# Patient Record
Sex: Female | Born: 1985 | Race: Black or African American | Hispanic: No | Marital: Single | Smoking: Never smoker
Health system: Southern US, Community
[De-identification: ages and names within clinical notes are randomized; demographics above are authoritative.]

## PROBLEM LIST (undated history)

## (undated) ENCOUNTER — Inpatient Hospital Stay (HOSPITAL_COMMUNITY): Payer: Self-pay

## (undated) DIAGNOSIS — B999 Unspecified infectious disease: Secondary | ICD-10-CM

## (undated) DIAGNOSIS — D649 Anemia, unspecified: Secondary | ICD-10-CM

## (undated) DIAGNOSIS — R0602 Shortness of breath: Secondary | ICD-10-CM

## (undated) HISTORY — DX: Unspecified infectious disease: B99.9

## (undated) HISTORY — DX: Anemia, unspecified: D64.9

## (undated) HISTORY — PX: ECTOPIC PREGNANCY SURGERY: SHX613

## (undated) HISTORY — DX: Shortness of breath: R06.02

---

## 2011-08-02 DIAGNOSIS — R0602 Shortness of breath: Secondary | ICD-10-CM

## 2011-08-02 HISTORY — DX: Shortness of breath: R06.02

## 2011-08-02 NOTE — L&D Delivery Note (Signed)
Delivery Note At 5:58 PM a viable female was delivered via Vaginal, Spontaneous Delivery (Presentation: Left Occiput Anterior).  APGAR: 9, 9; weight .   Placenta status: Intact, Spontaneous.  Cord: 3 vessels with the following complications: None.  Cord pH: not required. Baby had thin meconium with no problems.  Anesthesia: Epidural  Episiotomy: None Lacerations: 1st degree haemostatic Est. Blood Loss (mL): Uterus boggy: Methergine 0.2mg s IM  Mom to postpartum.  Baby to nursery-stable.  Earl Gala, CNM. 04/03/2012, 6:23 PM

## 2011-12-23 ENCOUNTER — Inpatient Hospital Stay (HOSPITAL_COMMUNITY)
Admission: AD | Admit: 2011-12-23 | Discharge: 2011-12-23 | Disposition: A | Discharge status: Home / Self Care | Payer: Self-pay | Source: Ambulatory Visit | Attending: Obstetrics and Gynecology | Admitting: Obstetrics and Gynecology

## 2011-12-23 ENCOUNTER — Encounter (HOSPITAL_COMMUNITY): Payer: Self-pay | Admitting: *Deleted

## 2011-12-23 ENCOUNTER — Inpatient Hospital Stay (HOSPITAL_COMMUNITY): Payer: Self-pay

## 2011-12-23 DIAGNOSIS — O368190 Decreased fetal movements, unspecified trimester, not applicable or unspecified: Secondary | ICD-10-CM | POA: Insufficient documentation

## 2011-12-23 DIAGNOSIS — F411 Generalized anxiety disorder: Secondary | ICD-10-CM | POA: Insufficient documentation

## 2011-12-23 DIAGNOSIS — O99340 Other mental disorders complicating pregnancy, unspecified trimester: Secondary | ICD-10-CM | POA: Insufficient documentation

## 2011-12-23 DIAGNOSIS — F419 Anxiety disorder, unspecified: Secondary | ICD-10-CM

## 2011-12-23 DIAGNOSIS — R0602 Shortness of breath: Secondary | ICD-10-CM | POA: Insufficient documentation

## 2011-12-23 NOTE — MAU Provider Note (Signed)
  History     CSN: [id number]  Arrival date and time: 12/23/11 1601   First Provider Initiated Contact with Patient 12/23/11 1747      Chief Complaint  Patient presents with  . Shortness of Breath   HPI  Pt reports feeling short of breath while talking with family member today.  Began feeling anxious while discussing decreased fetal movement.  Pt reports no prenatal care with this pregnancy.  Denies any other problems this pregnancy.  Denies chest pain or diaphoresis.    Past Medical History  Diagnosis Date  . Anxiety     self-diagnosis this preg    Past Surgical History  Procedure Date  . Ectopic pregnancy surgery     Family History  Problem Relation Age of Onset  . Diabetes Maternal Grandmother   . Hypertension Maternal Grandmother   . Stroke Maternal Grandmother   . Hypertension Paternal Grandmother   . Diabetes Paternal Grandmother   . Heart disease Paternal Grandmother     History  Substance Use Topics  . Smoking status: Never Smoker   . Smokeless tobacco: Never Used  . Alcohol Use: No    Allergies: No Known Allergies  No prescriptions prior to admission    Review of Systems  Respiratory: Positive for shortness of breath.   Psychiatric/Behavioral: The patient is nervous/anxious.   All other systems reviewed and are negative.   Physical Exam   Blood pressure 124/54, pulse 106, temperature 98.7 F (37.1 C), temperature source Oral, resp. rate 18, height 5\' 3"  (1.6 m), weight 71.668 kg (158 lb), SpO2 100.00%.  Physical Exam  Constitutional: She is oriented to person, place, and time. She appears well-developed and well-nourished. No distress.  HENT:  Head: Normocephalic.  Neck: Normal range of motion. Neck supple.  Cardiovascular: Normal rate, regular rhythm and normal heart sounds.   Respiratory: Effort normal and breath sounds normal. No respiratory distress. She has no wheezes. She has no rales.  GI: Soft. There is no tenderness.       Fundal  height - 25  Genitourinary: No bleeding around the vagina. Vaginal discharge (mucusy) found.  Neurological: She is alert and oriented to person, place, and time.  Skin: Skin is warm and dry.    MAU Course  Procedures Ultrasound IMPRESSION:  Single intrauterine pregnancy of approximally 26 weeks 4 days  gestation. Presentation is cephalic. Placenta is posterior.  FHR 150, 10x10 Toco - none Assessment and Plan  Anxiety Fetal Well-Being  Plan: DC to home Keep scheduled appointment with Dr. Clearance Coots Provided reassurance [facility] 12/23/2011, 5:50 PM

## 2011-12-23 NOTE — MAU Note (Signed)
Feeling short of breath today, more after eating lunch. No hx of asthma. States she feels this way whether she is moving or being still. Also has felt some pressure like reflux in upper abdomen and chest.

## 2011-12-23 NOTE — MAU Note (Signed)
No PNC. Medicaid in process. Has been going to pregnancy care center and has had U/S.

## 2011-12-23 NOTE — MAU Note (Signed)
A person with patient asking personal questions about patient and asking that patient get an U/S. Informed this person that patient information will not be discussed with her. Informed her that if patient wants her to be present, she can observe and listen, but she is not to ask questions or make demands regarding patient. The patient is now saying that she wants an U/S, that though she has had an U/S, does not know how far along she is. Asked patient if she came to MAU for shortness of breath or for an U/S. Patient said she came for both. Person with her thinks her SOB came from a panic attack because she was not feeling the baby move. Patient shows know signs of distress, is able to carry on unlimited conversation with friend and son.

## 2011-12-23 NOTE — Discharge Instructions (Signed)

## 2012-01-12 ENCOUNTER — Encounter (HOSPITAL_COMMUNITY): Payer: Self-pay | Admitting: *Deleted

## 2012-02-24 ENCOUNTER — Ambulatory Visit (INDEPENDENT_AMBULATORY_CARE_PROVIDER_SITE_OTHER): Payer: Medicaid Other | Admitting: Obstetrics and Gynecology

## 2012-02-24 DIAGNOSIS — Z331 Pregnant state, incidental: Secondary | ICD-10-CM

## 2012-02-24 DIAGNOSIS — Z3689 Encounter for other specified antenatal screening: Secondary | ICD-10-CM

## 2012-02-24 LAB — POCT URINALYSIS DIPSTICK
Nitrite, UA: NEGATIVE
Urobilinogen, UA: NEGATIVE

## 2012-02-24 NOTE — Progress Notes (Signed)
NOB interview completed.  Pt is late to care.  Seen once @ [facility].  Pt sched for NOB w/u on 02/29/12 @1100  w/ MK, also scheduled anatomy scan @ 1400.

## 2012-02-25 LAB — PRENATAL PANEL VII
Eosinophils Absolute: 0.1 10*3/uL (ref 0.0–0.7)
HCT: 25.2 % — ABNORMAL LOW (ref 36.0–46.0)
HIV: NONREACTIVE
Hemoglobin: 7.6 g/dL — ABNORMAL LOW (ref 12.0–15.0)
Hepatitis B Surface Ag: NEGATIVE
Lymphs Abs: 1.6 10*3/uL (ref 0.7–4.0)
MCH: 18 pg — ABNORMAL LOW (ref 26.0–34.0)
Monocytes Relative: 12 % (ref 3–12)
Neutro Abs: 3.7 10*3/uL (ref 1.7–7.7)
Neutrophils Relative %: 61 % (ref 43–77)
RBC: 4.22 MIL/uL (ref 3.87–5.11)
Rubella: 17 IU/mL — ABNORMAL HIGH

## 2012-02-27 LAB — CULTURE, OB URINE: Colony Count: >=100000

## 2012-02-28 LAB — HEMOGLOBINOPATHY EVALUATION: Hemoglobin Other: 0 %

## 2012-02-29 ENCOUNTER — Ambulatory Visit (INDEPENDENT_AMBULATORY_CARE_PROVIDER_SITE_OTHER): Payer: Medicaid Other | Admitting: Obstetrics and Gynecology

## 2012-02-29 ENCOUNTER — Ambulatory Visit (INDEPENDENT_AMBULATORY_CARE_PROVIDER_SITE_OTHER): Payer: Medicaid Other

## 2012-02-29 VITALS — BP 110/68 | Wt 167.0 lb

## 2012-02-29 DIAGNOSIS — D649 Anemia, unspecified: Secondary | ICD-10-CM

## 2012-02-29 DIAGNOSIS — O0930 Supervision of pregnancy with insufficient antenatal care, unspecified trimester: Secondary | ICD-10-CM

## 2012-02-29 DIAGNOSIS — Z3689 Encounter for other specified antenatal screening: Secondary | ICD-10-CM

## 2012-02-29 DIAGNOSIS — Z331 Pregnant state, incidental: Secondary | ICD-10-CM

## 2012-02-29 DIAGNOSIS — D509 Iron deficiency anemia, unspecified: Secondary | ICD-10-CM

## 2012-02-29 DIAGNOSIS — O2390 Unspecified genitourinary tract infection in pregnancy, unspecified trimester: Secondary | ICD-10-CM

## 2012-02-29 DIAGNOSIS — E611 Iron deficiency: Secondary | ICD-10-CM

## 2012-02-29 DIAGNOSIS — B951 Streptococcus, group B, as the cause of diseases classified elsewhere: Secondary | ICD-10-CM

## 2012-02-29 DIAGNOSIS — O2340 Unspecified infection of urinary tract in pregnancy, unspecified trimester: Secondary | ICD-10-CM

## 2012-02-29 DIAGNOSIS — N390 Urinary tract infection, site not specified: Secondary | ICD-10-CM

## 2012-02-29 LAB — RETICULOCYTES
RBC.: 4.07 MIL/uL (ref 3.87–5.11)
Retic Ct Pct: 2.3 % (ref 0.4–2.3)

## 2012-03-01 ENCOUNTER — Encounter: Payer: Self-pay | Admitting: Obstetrics and Gynecology

## 2012-03-01 ENCOUNTER — Telehealth: Payer: Self-pay | Admitting: Obstetrics and Gynecology

## 2012-03-01 DIAGNOSIS — O0930 Supervision of pregnancy with insufficient antenatal care, unspecified trimester: Secondary | ICD-10-CM | POA: Insufficient documentation

## 2012-03-01 DIAGNOSIS — B951 Streptococcus, group B, as the cause of diseases classified elsewhere: Secondary | ICD-10-CM | POA: Insufficient documentation

## 2012-03-01 DIAGNOSIS — D649 Anemia, unspecified: Secondary | ICD-10-CM | POA: Insufficient documentation

## 2012-03-01 LAB — GC/CHLAMYDIA PROBE AMP, GENITAL: GC Probe Amp, Genital: NEGATIVE

## 2012-03-01 LAB — PATHOLOGIST SMEAR REVIEW

## 2012-03-01 LAB — IRON AND TIBC: TIBC: 591 ug/dL — ABNORMAL HIGH (ref 250–470)

## 2012-03-01 MED ORDER — PENICILLIN V POTASSIUM 500 MG PO TABS: 500.0000 mg | ORAL_TABLET | Freq: Three times a day (TID) | ORAL | Status: AC

## 2012-03-01 NOTE — Telephone Encounter (Signed)
Message copied by Mason Jim on Thu Mar 01, 2012  3:37 PM ------      Message from: [facility], Corrie Dandy      Created: Thu Mar 01, 2012  1:51 PM      Regarding: RX and call to patient       Pen VK 500 mg po TID x 10 days       #30

## 2012-03-01 NOTE — Progress Notes (Signed)
C/O vag irritation. GC/CT sent. Pt is GBS + (urine). Follow up anatomy U/S today. Follow up in 1 week.

## 2012-03-01 NOTE — Progress Notes (Signed)
Patient ID: Judy Harris, female   DOB: 21-Aug-1985, 26 y.o.   MRN: [id number]  Judy Harris is here for a new obstetrical visit at 36 weeks, had an MAU visit at [facility] with [place] for dating. Uncertain of LMP. Denies SROM, vag bleeding, with +FM. "Craves flour I just keep eating it"Taking PNV and iron at times. [redacted]w[redacted]d Late PNC GBS + urine  @IPILAPH @ OB History    Grav Para Term Preterm Abortions TAB SAB Ect Mult Living   3 1 0 1 1   1  1      Past Medical History  Diagnosis Date  . Anemia     Has taken Iron supp;is currently taking now  . Infection     Yeast inf;not freq  . 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 Infection     UTI;no freq  . SOB (shortness of breath) 2013    Since the preg;SOB @ times   Past Surgical History  Procedure Date  . Ectopic pregnancy surgery    Family History: family history includes Cancer in her paternal aunt; Diabetes in her maternal grandmother and paternal grandmother; Heart disease in her paternal grandmother; Hypertension in her maternal grandmother and paternal grandmother; and Stroke in her maternal grandmother. Social History:  reports that she has never smoked. She has never used smokeless tobacco. She reports that she drinks alcohol. She reports that she does not use illicit drugs.  Dilation: Closed Blood pressure 110/68, weight 167 lb (75.751 kg).  Physical exam: Calm, no distress Alert, appropriate appearance for age. No acute distress HEENT Grossly normal Thyroid no masses, nodes or enlargement  lungs clear bilaterally, AP RRR, breasts bilaterally no masses, dimpling, drainage, abd soft, gravid, nt, bowel sounds active no edema to lower extremities, DTRS +2 bilaterally EGBUS and perineum wnl, normal hair distribution Vagina pink moist Cervix LTC no cerical motion tenderness Yellow mucus discharge appears adequate Fundal height 36 FHTS 150  Prenatal labs: ABO, Rh: O/POS/-- (07/26 1212) Antibody: NEG (07/26 1212) Rubella:   RPR:  NON REAC (07/26 1212)  HBsAg: NEGATIVE (07/26 1212)  HIV: NON REACTIVE (07/26 1212)  GBS:   positive urine  Assessment/Plan: [redacted]w[redacted]d  Gc/chl sent to lab Wet prep: neg trich, +hyphae, +clue Pap: 2012 last will do after delivery [place]: at [facility] for dating will obtain US anatomy today. Genetic screen: to late Anemia lab workup discussed. RX flagyl, diflucan, Pen VK, colace, iron discussed.  Collaboration with Dr. Stefano Harris. [facility], Judy Harris 03/01/2012, 6:32 PM Judy Harris, CNM

## 2012-03-01 NOTE — Telephone Encounter (Signed)
TC to pt.  Per [facility] informed of urine culture results and Rx.  Pt states was given RX for Flagyl and Diflucan 02/29/12.  Took one dose today. Questioning if should take all the meds concurrently.  Per consult with MK, pt to take Pen VK now and D/C Flagyl and Diflucan.  When Pen VK completed, then restart Flagyl and take Diflucan near end of treatment.  Pt verbalizes comprehension. +FM.

## 2012-03-02 LAB — HEMOGLOBINOPATHY EVALUATION
Hgb A: 98.9 % — ABNORMAL HIGH (ref 96.8–97.8)
Hgb F Quant: 0 % (ref 0.0–2.0)
Hgb S Quant: 0 %

## 2012-03-06 ENCOUNTER — Telehealth: Payer: Self-pay | Admitting: Obstetrics and Gynecology

## 2012-03-06 NOTE — Telephone Encounter (Signed)
TC to pt. LM to return call. Per [facility] pt to be informed that will need 1 hour glucola  At visit 03/07/12. To eat appropriately prior to visit.

## 2012-03-07 ENCOUNTER — Ambulatory Visit (INDEPENDENT_AMBULATORY_CARE_PROVIDER_SITE_OTHER): Payer: Medicaid Other | Admitting: Obstetrics and Gynecology

## 2012-03-07 ENCOUNTER — Other Ambulatory Visit: Payer: Self-pay | Admitting: Obstetrics and Gynecology

## 2012-03-07 ENCOUNTER — Encounter: Payer: Self-pay | Admitting: Obstetrics and Gynecology

## 2012-03-07 VITALS — BP 124/56 | Wt 169.0 lb

## 2012-03-07 DIAGNOSIS — D649 Anemia, unspecified: Secondary | ICD-10-CM

## 2012-03-07 DIAGNOSIS — O0930 Supervision of pregnancy with insufficient antenatal care, unspecified trimester: Secondary | ICD-10-CM

## 2012-03-07 LAB — US OB DETAIL + 14 WK

## 2012-03-07 NOTE — Progress Notes (Signed)
NO CONCERNS 

## 2012-03-07 NOTE — Progress Notes (Signed)
Patient ID: Judy Harris, female   DOB: [birth year], 26 y.o.   MRN: [id number] Anemia work up WNL review with Dr. Stefano Gaul. [place] on 7/31 AFI 15 BPP 8/8 50% growth  VTX anatomy limited  Reviewed s/s uc, srom, vag bleeding, daily fetal kick counts to report, comfort measures. Encouragged 8 water daily and frequent voids. Lavera Guise, CNM

## 2012-03-08 LAB — US OB COMP + 14 WK

## 2012-03-12 ENCOUNTER — Ambulatory Visit (INDEPENDENT_AMBULATORY_CARE_PROVIDER_SITE_OTHER): Payer: Medicaid Other | Admitting: Obstetrics and Gynecology

## 2012-03-12 VITALS — BP 130/74 | Wt 171.0 lb

## 2012-03-12 DIAGNOSIS — Z331 Pregnant state, incidental: Secondary | ICD-10-CM

## 2012-03-12 NOTE — Progress Notes (Signed)
[redacted]w[redacted]d No complaints 

## 2012-03-12 NOTE — Progress Notes (Signed)
GBS + Pt has no complaints today. No cx check please.

## 2012-03-14 ENCOUNTER — Telehealth: Payer: Self-pay

## 2012-03-14 ENCOUNTER — Other Ambulatory Visit: Payer: Medicaid Other

## 2012-03-14 ENCOUNTER — Other Ambulatory Visit: Payer: Self-pay

## 2012-03-14 DIAGNOSIS — Z331 Pregnant state, incidental: Secondary | ICD-10-CM

## 2012-03-14 NOTE — Telephone Encounter (Signed)
Per AVS pt needs NST' twice weekly.' Pt was called this morning to schedule NST's for this week. Pt stated she cannot come in this week b/c she has to work. Pt stated she will be off next Tuesday, Wednesday, Thursday. Pt already has a ROB visit on 03-20-12 w/ [place] @ 4:20pm.  NST scheduled for 03-20-12 @ 3:30pm. Pt stated she will make next NST appt when she comes in the office Tuesday.  [facility] CMA

## 2012-03-20 ENCOUNTER — Other Ambulatory Visit: Payer: Medicaid Other

## 2012-03-20 ENCOUNTER — Encounter: Payer: Medicaid Other | Admitting: Obstetrics and Gynecology

## 2012-03-20 ENCOUNTER — Ambulatory Visit: Payer: Medicaid Other | Admitting: Obstetrics and Gynecology

## 2012-03-21 NOTE — Progress Notes (Unsigned)
DNKA-will have office follow-up to R/S patient.

## 2012-03-22 ENCOUNTER — Other Ambulatory Visit: Payer: Medicaid Other

## 2012-03-22 ENCOUNTER — Ambulatory Visit (INDEPENDENT_AMBULATORY_CARE_PROVIDER_SITE_OTHER): Payer: Medicaid Other | Admitting: Obstetrics and Gynecology

## 2012-03-22 ENCOUNTER — Encounter: Payer: Self-pay | Admitting: Obstetrics and Gynecology

## 2012-03-22 ENCOUNTER — Other Ambulatory Visit: Payer: Medicaid Other | Admitting: Obstetrics and Gynecology

## 2012-03-22 DIAGNOSIS — O365990 Maternal care for other known or suspected poor fetal growth, unspecified trimester, not applicable or unspecified: Secondary | ICD-10-CM

## 2012-03-22 NOTE — Progress Notes (Signed)
[redacted]w[redacted]d Nonstress test is reactive.  Twice weekly nonstress test. Growth ultrasound next visit. Note for work given. Return to office in 1 week. Repeat blood pressure 134/72. Dr. Stefano Gaul

## 2012-03-24 NOTE — Progress Notes (Signed)
Anemia workup shows iron deficiency anemia. Urine culture contain beta strep.  We'll treat in labor.  Dr. Stefano Gaul

## 2012-03-28 ENCOUNTER — Ambulatory Visit (INDEPENDENT_AMBULATORY_CARE_PROVIDER_SITE_OTHER): Payer: Medicaid Other

## 2012-03-28 ENCOUNTER — Encounter: Payer: Self-pay | Admitting: Obstetrics and Gynecology

## 2012-03-28 ENCOUNTER — Ambulatory Visit (INDEPENDENT_AMBULATORY_CARE_PROVIDER_SITE_OTHER): Payer: Medicaid Other | Admitting: Obstetrics and Gynecology

## 2012-03-28 ENCOUNTER — Other Ambulatory Visit: Payer: Self-pay | Admitting: Obstetrics and Gynecology

## 2012-03-28 VITALS — BP 108/60 | Wt 170.0 lb

## 2012-03-28 DIAGNOSIS — O0930 Supervision of pregnancy with insufficient antenatal care, unspecified trimester: Secondary | ICD-10-CM

## 2012-03-28 DIAGNOSIS — O365990 Maternal care for other known or suspected poor fetal growth, unspecified trimester, not applicable or unspecified: Secondary | ICD-10-CM

## 2012-03-28 DIAGNOSIS — O26849 Uterine size-date discrepancy, unspecified trimester: Secondary | ICD-10-CM

## 2012-03-28 NOTE — Progress Notes (Signed)
[redacted]w[redacted]d  Ultrasound shows:  EFW 6lb 6oz   17%ILE       AFI: 13.0= 55%ile             Placenta localization: posterior            Fetal presentation: vertex    Anatomy survey is normal            Gender : female    Comments: vertex presentation, posterior placenta.  Normal fluid: AFI = 55th%tile EFW = 17%tile Score BPP 8 of 8 in 20 minutes,  Rec: induction at 41 wks if undelivered

## 2012-03-29 ENCOUNTER — Telehealth: Payer: Self-pay | Admitting: Obstetrics and Gynecology

## 2012-03-29 NOTE — Telephone Encounter (Signed)
Induction scheduled for 04/03/12 @ 7:30am with DD/AR. -Adrianne Pridgen

## 2012-03-30 ENCOUNTER — Other Ambulatory Visit: Payer: Medicaid Other

## 2012-03-30 ENCOUNTER — Telehealth (HOSPITAL_COMMUNITY): Payer: Self-pay | Admitting: *Deleted

## 2012-03-30 ENCOUNTER — Encounter (HOSPITAL_COMMUNITY): Payer: Self-pay | Admitting: *Deleted

## 2012-03-30 LAB — US OB FOLLOW UP

## 2012-03-30 NOTE — Telephone Encounter (Signed)
Preadmission screen  

## 2012-04-03 ENCOUNTER — Encounter (HOSPITAL_COMMUNITY): Payer: Self-pay | Admitting: Anesthesiology

## 2012-04-03 ENCOUNTER — Inpatient Hospital Stay (HOSPITAL_COMMUNITY): Payer: Medicaid Other | Admitting: Anesthesiology

## 2012-04-03 ENCOUNTER — Encounter (HOSPITAL_COMMUNITY): Payer: Self-pay

## 2012-04-03 ENCOUNTER — Inpatient Hospital Stay (HOSPITAL_COMMUNITY)
Admission: RE | Admit: 2012-04-03 | Discharge: 2012-04-05 | DRG: 775 | Disposition: A | Discharge status: Home / Self Care | Payer: Medicaid Other | Source: Ambulatory Visit | Attending: Obstetrics and Gynecology | Admitting: Obstetrics and Gynecology

## 2012-04-03 DIAGNOSIS — O99892 Other specified diseases and conditions complicating childbirth: Secondary | ICD-10-CM | POA: Diagnosis present

## 2012-04-03 DIAGNOSIS — O480 Post-term pregnancy: Principal | ICD-10-CM | POA: Diagnosis present

## 2012-04-03 DIAGNOSIS — O2340 Unspecified infection of urinary tract in pregnancy, unspecified trimester: Secondary | ICD-10-CM | POA: Diagnosis present

## 2012-04-03 DIAGNOSIS — O9902 Anemia complicating childbirth: Secondary | ICD-10-CM | POA: Diagnosis present

## 2012-04-03 DIAGNOSIS — B951 Streptococcus, group B, as the cause of diseases classified elsewhere: Secondary | ICD-10-CM | POA: Diagnosis present

## 2012-04-03 DIAGNOSIS — D649 Anemia, unspecified: Secondary | ICD-10-CM | POA: Diagnosis present

## 2012-04-03 DIAGNOSIS — O0930 Supervision of pregnancy with insufficient antenatal care, unspecified trimester: Secondary | ICD-10-CM

## 2012-04-03 DIAGNOSIS — Z22330 Carrier of Group B streptococcus: Secondary | ICD-10-CM

## 2012-04-03 LAB — CBC
HCT: 26.8 % — ABNORMAL LOW (ref 36.0–46.0)
MCH: 18.7 pg — ABNORMAL LOW (ref 26.0–34.0)
MCHC: 28 g/dL — ABNORMAL LOW (ref 30.0–36.0)
MCV: 66.8 fL — ABNORMAL LOW (ref 78.0–100.0)
Platelets: 208 10*3/uL (ref 150–400)
RDW: 22.9 % — ABNORMAL HIGH (ref 11.5–15.5)
WBC: 6.1 10*3/uL (ref 4.0–10.5)

## 2012-04-03 LAB — ABO/RH: ABO/RH(D): O POS

## 2012-04-03 MED ORDER — DIPHENHYDRAMINE HCL 25 MG PO CAPS: 25.0000 mg | ORAL_CAPSULE | Freq: Four times a day (QID) | ORAL | Status: DC | PRN

## 2012-04-03 MED ORDER — LACTATED RINGERS IV SOLN
INTRAVENOUS | Status: DC
  Administered 2012-04-03: 09:00:00 via INTRAVENOUS
  Administered 2012-04-03 (×2): 125 mL/h via INTRAVENOUS

## 2012-04-03 MED ORDER — FENTANYL CITRATE 0.05 MG/ML IJ SOLN: 100.0000 ug | INTRAMUSCULAR | Status: DC | PRN

## 2012-04-03 MED ORDER — FENTANYL 2.5 MCG/ML BUPIVACAINE 1/10 % EPIDURAL INFUSION (WH - ANES)
14.0000 mL/h | INTRAMUSCULAR | Status: DC
  Administered 2012-04-03: 14 mL/h via EPIDURAL
  Filled 2012-04-03 (×2): qty 60

## 2012-04-03 MED ORDER — PRENATAL MULTIVITAMIN CH
1.0000 | ORAL_TABLET | Freq: Every day | ORAL | Status: DC
  Administered 2012-04-04 – 2012-04-05 (×2): 1 via ORAL
  Filled 2012-04-03 (×2): qty 1

## 2012-04-03 MED ORDER — PENICILLIN G POTASSIUM 5000000 UNITS IJ SOLR
2.5000 10*6.[IU] | INTRAVENOUS | Status: DC
  Administered 2012-04-03: 2.5 10*6.[IU] via INTRAVENOUS
  Filled 2012-04-03 (×4): qty 2.5

## 2012-04-03 MED ORDER — WITCH HAZEL-GLYCERIN EX PADS: 1.0000 "application " | MEDICATED_PAD | CUTANEOUS | Status: DC | PRN

## 2012-04-03 MED ORDER — TETANUS-DIPHTH-ACELL PERTUSSIS 5-2.5-18.5 LF-MCG/0.5 IM SUSP: 0.5000 mL | Freq: Once | INTRAMUSCULAR | Status: DC

## 2012-04-03 MED ORDER — SENNOSIDES-DOCUSATE SODIUM 8.6-50 MG PO TABS
2.0000 | ORAL_TABLET | Freq: Every day | ORAL | Status: DC
  Administered 2012-04-03 – 2012-04-04 (×2): 2 via ORAL

## 2012-04-03 MED ORDER — DIPHENHYDRAMINE HCL 50 MG/ML IJ SOLN: 12.5000 mg | INTRAMUSCULAR | Status: DC | PRN

## 2012-04-03 MED ORDER — OXYCODONE-ACETAMINOPHEN 5-325 MG PO TABS: 1.0000 | ORAL_TABLET | ORAL | Status: DC | PRN

## 2012-04-03 MED ORDER — FENTANYL 2.5 MCG/ML BUPIVACAINE 1/10 % EPIDURAL INFUSION (WH - ANES)
INTRAMUSCULAR | Status: DC | PRN
  Administered 2012-04-03: 14 mL/h via EPIDURAL

## 2012-04-03 MED ORDER — PHENYLEPHRINE 40 MCG/ML (10ML) SYRINGE FOR IV PUSH (FOR BLOOD PRESSURE SUPPORT)
80.0000 ug | PREFILLED_SYRINGE | INTRAVENOUS | Status: DC | PRN
  Filled 2012-04-03: qty 5
  Filled 2012-04-03: qty 2

## 2012-04-03 MED ORDER — LACTATED RINGERS IV SOLN
500.0000 mL | INTRAVENOUS | Status: DC | PRN
  Administered 2012-04-03: 300 mL via INTRAVENOUS
  Administered 2012-04-03 (×2): 500 mL via INTRAVENOUS
  Administered 2012-04-03: 1000 mL via INTRAVENOUS

## 2012-04-03 MED ORDER — LANOLIN HYDROUS EX OINT: TOPICAL_OINTMENT | CUTANEOUS | Status: DC | PRN

## 2012-04-03 MED ORDER — ACETAMINOPHEN 325 MG PO TABS: 650.0000 mg | ORAL_TABLET | ORAL | Status: DC | PRN

## 2012-04-03 MED ORDER — PHENYLEPHRINE 40 MCG/ML (10ML) SYRINGE FOR IV PUSH (FOR BLOOD PRESSURE SUPPORT)
80.0000 ug | PREFILLED_SYRINGE | INTRAVENOUS | Status: DC | PRN
  Filled 2012-04-03: qty 2

## 2012-04-03 MED ORDER — LIDOCAINE HCL (PF) 1 % IJ SOLN
INTRAMUSCULAR | Status: DC | PRN
  Administered 2012-04-03 (×2): 9 mL

## 2012-04-03 MED ORDER — OXYCODONE-ACETAMINOPHEN 5-325 MG PO TABS
1.0000 | ORAL_TABLET | ORAL | Status: DC | PRN
  Administered 2012-04-04 (×2): 1 via ORAL
  Filled 2012-04-03 (×2): qty 1

## 2012-04-03 MED ORDER — OXYTOCIN 40 UNITS IN LACTATED RINGERS INFUSION - SIMPLE MED
62.5000 mL/h | Freq: Once | INTRAVENOUS | Status: DC
  Filled 2012-04-03: qty 1000

## 2012-04-03 MED ORDER — OXYTOCIN BOLUS FROM INFUSION
250.0000 mL | Freq: Once | INTRAVENOUS | Status: AC
  Administered 2012-04-03: 250 mL via INTRAVENOUS
  Filled 2012-04-03: qty 500

## 2012-04-03 MED ORDER — SIMETHICONE 80 MG PO CHEW: 80.0000 mg | CHEWABLE_TABLET | ORAL | Status: DC | PRN

## 2012-04-03 MED ORDER — BENZOCAINE-MENTHOL 20-0.5 % EX AERO: 1.0000 "application " | INHALATION_SPRAY | CUTANEOUS | Status: DC | PRN

## 2012-04-03 MED ORDER — CITRIC ACID-SODIUM CITRATE 334-500 MG/5ML PO SOLN
30.0000 mL | ORAL | Status: DC | PRN
  Filled 2012-04-03: qty 15

## 2012-04-03 MED ORDER — ONDANSETRON HCL 4 MG/2ML IJ SOLN
4.0000 mg | Freq: Four times a day (QID) | INTRAMUSCULAR | Status: DC | PRN
  Filled 2012-04-03: qty 2

## 2012-04-03 MED ORDER — ZOLPIDEM TARTRATE 5 MG PO TABS: 5.0000 mg | ORAL_TABLET | Freq: Every evening | ORAL | Status: DC | PRN

## 2012-04-03 MED ORDER — PENICILLIN G POTASSIUM 5000000 UNITS IJ SOLR
5.0000 10*6.[IU] | Freq: Once | INTRAVENOUS | Status: AC
  Administered 2012-04-03: 5 10*6.[IU] via INTRAVENOUS
  Filled 2012-04-03: qty 5

## 2012-04-03 MED ORDER — TERBUTALINE SULFATE 1 MG/ML IJ SOLN
INTRAMUSCULAR | Status: AC
  Administered 2012-04-03: 0.25 mg via SUBCUTANEOUS
  Filled 2012-04-03: qty 1

## 2012-04-03 MED ORDER — FLEET ENEMA 7-19 GM/118ML RE ENEM: 1.0000 | ENEMA | RECTAL | Status: DC | PRN

## 2012-04-03 MED ORDER — IBUPROFEN 600 MG PO TABS
600.0000 mg | ORAL_TABLET | Freq: Four times a day (QID) | ORAL | Status: DC
  Administered 2012-04-03 – 2012-04-05 (×7): 600 mg via ORAL
  Filled 2012-04-03 (×7): qty 1

## 2012-04-03 MED ORDER — LACTATED RINGERS IV SOLN: 500.0000 mL | Freq: Once | INTRAVENOUS | Status: DC

## 2012-04-03 MED ORDER — IBUPROFEN 600 MG PO TABS: 600.0000 mg | ORAL_TABLET | Freq: Four times a day (QID) | ORAL | Status: DC | PRN

## 2012-04-03 MED ORDER — EPHEDRINE 5 MG/ML INJ
10.0000 mg | INTRAVENOUS | Status: DC | PRN
  Filled 2012-04-03: qty 2
  Filled 2012-04-03: qty 4

## 2012-04-03 MED ORDER — LIDOCAINE HCL (PF) 1 % IJ SOLN
30.0000 mL | INTRAMUSCULAR | Status: DC | PRN
  Filled 2012-04-03: qty 30

## 2012-04-03 MED ORDER — METHYLERGONOVINE MALEATE 0.2 MG/ML IJ SOLN
INTRAMUSCULAR | Status: AC
  Administered 2012-04-03: 0.2 mg via INTRAMUSCULAR
  Filled 2012-04-03: qty 1

## 2012-04-03 MED ORDER — MISOPROSTOL 25 MCG QUARTER TABLET
25.0000 ug | ORAL_TABLET | ORAL | Status: DC
  Administered 2012-04-03: 25 ug via VAGINAL
  Filled 2012-04-03: qty 0.25

## 2012-04-03 MED ORDER — ONDANSETRON HCL 4 MG/2ML IJ SOLN
4.0000 mg | Freq: Four times a day (QID) | INTRAMUSCULAR | Status: DC | PRN
  Administered 2012-04-03: 4 mg via INTRAVENOUS

## 2012-04-03 MED ORDER — EPHEDRINE 5 MG/ML INJ
10.0000 mg | INTRAVENOUS | Status: DC | PRN
  Filled 2012-04-03: qty 2

## 2012-04-03 MED ORDER — DIBUCAINE 1 % RE OINT: 1.0000 "application " | TOPICAL_OINTMENT | RECTAL | Status: DC | PRN

## 2012-04-03 NOTE — Anesthesia Procedure Notes (Signed)
Epidural Patient location during procedure: OB Start time: 04/03/2012 1:12 PM End time: 04/03/2012 1:16 PM  Staffing Anesthesiologist: Sandrea Hughs Performed by: anesthesiologist   Preanesthetic Checklist Completed: patient identified, site marked, surgical consent, pre-op evaluation, timeout performed, IV checked, risks and benefits discussed and monitors and equipment checked  Epidural Patient position: sitting Prep: site prepped and draped and DuraPrep Patient monitoring: continuous pulse ox and blood pressure Approach: midline Injection technique: LOR air  Needle:  Needle type: Tuohy  Needle gauge: 17 G Needle length: 9 cm and 9 Needle insertion depth: 5 cm cm Catheter type: closed end flexible Catheter size: 19 Gauge Catheter at skin depth: 10 cm Test dose: negative and Other  Assessment Sensory level: T8 Events: blood not aspirated, injection not painful, no injection resistance, negative IV test and no paresthesia  Additional Notes Reason for block:procedure for pain

## 2012-04-03 NOTE — Progress Notes (Signed)
Terbutaline 0.25mg s s/c x 1 (2nd dose) Uterine activity better now and ucs 1: 2 - 3 mins FHT's 155 bpm Earl Gala, CNM.

## 2012-04-03 NOTE — H&P (Signed)
Judy Harris is a 26 y.o. female, G3P1011 at 51w1dweeks, presenting for IOL for post dates.  Patient Active Problem List  Diagnosis  . Late prenatal care  . GBS (group B streptococcus) UTI complicating currentpregnancy  . Anemia  . Preterm delivery, delivered  . Significant discrepancy between uterine size and clinical dates, antepartum    History of present pregnancy: Patient entered care at [redacted]w[redacted]d weeks.  EDC of 03/26/12 was established by USS.  Anatomy USS was not completed due to advanced GA. Posterior Placenta. Late to [facility] at [redacted]w[redacted]d which has complicated pregnancy management. Issues with insurance cover. GBS Positive: Will need to have GBS prophylaxis in labor.  OB History    Grav Para Term Preterm Abortions TAB SAB Ect Mult Living   3 1 0 1 1   1  1      Past Medical History  Diagnosis Date  . Anemia     Has taken Iron supp;is currently taking now  . Infection     Yeast inf;not freq  . 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 Infection     UTI;no freq  . SOB (shortness of breath) 2013    Since the preg;SOB @ times   Past Surgical History  Procedure Date  . Ectopic pregnancy surgery    Family History: family history includes Cancer in her paternal aunt; Diabetes in her maternal grandmother and paternal grandmother; Heart disease in her paternal grandmother; Hypertension in her maternal grandmother and paternal grandmother; and Stroke in her maternal grandmother. Social History:  reports that she has never smoked. She has never used smokeless tobacco. She reports that she drinks alcohol. She reports that she does not use illicit drugs.  ROS:  Affect; Nervous as needle phobia.            Lungs: Bilaterally Clear            CV:      RRR            Abdomen: Soft, Gravid, and measures 36 weeks approx,                             BS x 4 non tender.            Leopold's; Vx resentation.            GU: Normal            Extremities: No swelling, no edema.                  No Known Allergies   Dilation: 1.5 Effacement (%): 40 Station: -2 Exam by:: Earl Gala, CNM Blood pressure 133/80, pulse 116, temperature 98.1 F (36.7 C), temperature source Oral, resp. rate 18, height 5\' 4"  (1.626 m), weight 170 lb (77.111 kg).  Chest clear Heart RRR without murmur Abd gravid, NT, FH 145 bpm Cat 1 Tracing Pelvic: as above UCs:  1: 7 - 8 mins mild.  Prenatal labs: ABO, Rh: O/POS/-- (07/26 1212) Antibody: NEG (07/26 1212) Rubella:   Immune RPR: NON REAC (07/26 1212)  HBsAg: NEGATIVE (07/26 1212)  HIV: NON REACTIVE (07/26 1212)  GBS: Positive (07/26 0000) Sickle cell/Hgb electrophoresis:  neg Pap: Not collected GC: neg Chlamydia:  Neg Genetic screenings: not done Glucola:  To late to [facility] - not completed        Assessment/Plan: IUP at [redacted]w[redacted]d   Plan: IOL Cytotec to ripen Cervix.  Zacharias Ridling,  [place], [place] 04/03/2012, 9:29 AM

## 2012-04-03 NOTE — Progress Notes (Signed)
Patient has had 2 decelerations in past 15 mins since placement of Cytotec 25 mcgs PV  O VSS     Baseline 145 bpm,  First deceerationl at approx. 11.10 hrs.  Decel to the 80's and position change and      Spontaneously to baseline 1465 bpm with moderate variability.     15 mins later the FHT's deceleration to 80's lasting 6 mins. Intra uterine reassociation.- position change to      [place] position. With these measures FHT;s return to baseline 145 bpm with moderate variability.     Position and O2 via mask, Bolus IV fluid LR. Uc's 1: 2 -3 mins regular.     Patient is tolerating contractions well.     Dr Su Hilt contacted and informed of the events.  A IOL postdates - [redacted]w[redacted]d  - has tried Cytotec25 mcgs x 1 with decelerations.  P To stop cytotec stat     Allow 30 mins for baby to recover.     Foley bulb to water traction.   Earl Gala, CNM.

## 2012-04-03 NOTE — Progress Notes (Signed)
Patient is now contracting 1: 2 mins - has had a run of tachysystolia. O BP 121/74  Pulse 79  Temp 98.2 F (36.8 C) (Axillary)  Resp 20  Ht 5\' 4"  (1.626 m)  Wt 170 lb (77.111 kg)  BMI 29.18 kg/m2       fhts baseline 155 with moderate variability      12.10 hrs AROM - clear, small amount of fluid, FSE and IUPC placed.      12.10 hrs spontaneous deceleration for 5 mins, Position change to Gaskin's position.       FHT's  Return to baseline with this maneuver.      Dr. Su Hilt called and to have Terbutaline 0.25mg s s/c x 1 stat.      To Observe and if starts to space to start Pitocin      abd soft between uc      Contractions1 :       Vag 4/80%/-1 AROM, internalized A  AROM with internal monitors P  Terb and Observe   Earl Gala, CNM.

## 2012-04-03 NOTE — Progress Notes (Signed)
Comfortable,with epidural O BP 113/72  Pulse 99  Temp 98.5 F (36.9 C) (Oral)  Resp 20  Ht 5\' 4"  (1.626 m)  Wt 170 lb (77.111 kg)  BMI 29.18 kg/m2  SpO2 100%       fhts  Had deceleration to 80's, position changed and IUPC had fallen out  And replaced.      FSE attached but not working well - external monitor continues.      abd soft between uc.       Contractions 1: 2  mins        SVE: 4- 5 loose, 80%/-2,  Vx not well applied to Cx, moulding noted.  A IOL for post dates. S/p Cytotec x 1 dose, AROM, IUPC with External FHT  P Continue care , Hold on order for Terbutaline at the moment    Continue to monitor.  Earl Gala, CNM.

## 2012-04-03 NOTE — Anesthesia Preprocedure Evaluation (Signed)
Anesthesia Evaluation  Patient identified by MRN, date of birth, ID band Patient awake    Reviewed: Allergy & Precautions, H&P , NPO status , Patient's Chart, lab work & pertinent test results  Airway Mallampati: II TM Distance: >3 FB Neck ROM: full    Dental No notable dental hx.    Pulmonary    Pulmonary exam normal       Cardiovascular negative cardio ROS      Neuro/Psych negative neurological ROS  negative psych ROS   GI/Hepatic negative GI ROS, Neg liver ROS,   Endo/Other  negative endocrine ROS  Renal/GU negative Renal ROS  negative genitourinary   Musculoskeletal negative musculoskeletal ROS (+)   Abdominal Normal abdominal exam  (+)   Peds negative pediatric ROS (+)  Hematology negative hematology ROS (+)   Anesthesia Other Findings   Reproductive/Obstetrics (+) Pregnancy                           Anesthesia Physical Anesthesia Plan  ASA: II  Anesthesia Plan: Epidural   Post-op Pain Management:    Induction:   Airway Management Planned:   Additional Equipment:   Intra-op Plan:   Post-operative Plan:   Informed Consent: I have reviewed the patients History and Physical, chart, labs and discussed the procedure including the risks, benefits and alternatives for the proposed anesthesia with the patient or authorized representative who has indicated his/her understanding and acceptance.     Plan Discussed with:   Anesthesia Plan Comments:         Anesthesia Quick Evaluation  

## 2012-04-03 NOTE — Progress Notes (Signed)
Comfortable, with epidural O VSS BP 114/71  Pulse 108  Temp 98.5 F (36.9 C) (Oral)  Resp 20  Ht 5\' 4"  (1.626 m)  Wt 170 lb (77.111 kg)  BMI 29.18 kg/m2  SpO2 100%       fhts category 1 baseline has elevated 160 bpm      abd soft between uc      Contractions  Irregular pattern 1: 2 -      Continues on O2 mask 10 litres min       A IOL for post dates [redacted]w[redacted]d    Uterine tachy and s/p Terbutaline 0.25mg s s/c x 1  P continue care  Earl Gala, CNM.

## 2012-04-03 NOTE — Progress Notes (Signed)
Comfortable, some pressure with each uc - comfortable with epidural O VSS      fhts cat 2 tracing with occasional late decels and position change and now  late decels resolved      abd soft between uc      Contractions 1: 1-2 mins      Vag 9/90%/+1 vx ROA A progressing well in labor P continue care - Dr.Roberts aware of patient condition and progress to date.  Earl Gala, CNM.

## 2012-04-04 MED ORDER — FERROUS SULFATE 325 (65 FE) MG PO TABS
325.0000 mg | ORAL_TABLET | Freq: Two times a day (BID) | ORAL | Status: DC
  Administered 2012-04-05: 325 mg via ORAL
  Filled 2012-04-04: qty 1

## 2012-04-04 MED ORDER — DOCUSATE SODIUM 100 MG PO CAPS
100.0000 mg | ORAL_CAPSULE | Freq: Two times a day (BID) | ORAL | Status: DC
  Administered 2012-04-04 – 2012-04-05 (×2): 100 mg via ORAL
  Filled 2012-04-04 (×2): qty 1

## 2012-04-04 NOTE — Progress Notes (Signed)
Mom asked that the baby stay in the nursery so that she could "get some rest." She also said that she would rather her "boyfriend" and 28 year old son be in the room before the baby was brought back. Then, at 0530, with the boyfriend and son in the room, she was asked if she would like to have the baby brought in so that she could feed the baby a bottle, she said that she "just needed to get her nap out." When asked what time she thought she would like to see the baby, she said she would let us know.

## 2012-04-04 NOTE — Addendum Note (Signed)
Addendum  created 04/04/12 1044 by Alter Moss A Wilmina Maxham, CRNA   Modules edited:Charges VN, Notes Section    

## 2012-04-04 NOTE — Progress Notes (Signed)
Midwife called to inquire if patient's saline locks could be discontinued.  No note had been documented that patient had been seen today.  CNM informed of pt's hemoglobin of 7.5gm and refusal of morning CBC draw.Midwife had not been able to document AM visit. Order received to obtain orthostatic vs and record.

## 2012-04-04 NOTE — Progress Notes (Addendum)
Post Partum Day 1 NSVD female infant Subjective: no complaints, up ad lib without syncope, voiding, tolerating PO, + flatus  Pain well controlled with po meds BF formula feeding - slow to feed Mood stable, bonding well Contraception:  Depo Provera on discharge and Mirena at 6 weeks.   Objective: Blood pressure 108/72, pulse 92, temperature 98.3 F (36.8 C), temperature source Oral, resp. rate 18, height 5\' 4"  (1.626 m), weight 170 lb (77.111 kg), SpO2 98.00%, unknown if currently breastfeeding.  Physical Exam:  General: alert, cooperative and no distress Lungs: CTAB Heart: RRR Breasts: nontender Lochia: appropriate Uterine Fundus: firm Perineum: healing well DVT Evaluation: No evidence of DVT seen on physical exam. Negative Homan's sign.   Basename 04/03/12 0840  HGB 7.5*  HCT 26.8*   Patient refused Orthostatic Vital Signs. Condition stable and up walking in room and halls with no adverse effect. Severe PP anemia. Commenced on po Iron Supplement.  Assessment/Plan: Plan for discharge tomorrow      LOS: 1 day   Judy Harris 04/04/2012, 6:03 PM

## 2012-04-04 NOTE — Anesthesia Postprocedure Evaluation (Signed)
Anesthesia Post Note  Patient: Judy Harris  Procedure(s) Performed: * No procedures listed *  Anesthesia type: Epidural  Patient location: Mother/Baby  Post pain: Pain level controlled  Post assessment: Post-op Vital signs reviewed  Last Vitals:  Filed Vitals:   04/04/12 0519  BP: 115/75  Pulse: 80  Temp: 36.7 C  Resp: 18    Post vital signs: Reviewed  Level of consciousness:alert  Complications: No apparent anesthesia complications

## 2012-04-04 NOTE — Addendum Note (Signed)
Addendum  created 04/04/12 1044 by Algis Greenhouse, CRNA   Modules edited:Charges VN, Notes Section

## 2012-04-04 NOTE — Progress Notes (Signed)
Patient Judy Harris refused to have her routine CBC lab drawn this AM. Lab technician and RN explained to patient the importance of the lab test for hemoglobin levels etc, but she said that she had a phobia of needles and refused. Patient wanted lab to draw from her IV, but it was explained that this was not possible. Lab technician explained that she would use the tiniest needle she had, but patient still refused.

## 2012-04-04 NOTE — Progress Notes (Signed)
UR chart review completed.  

## 2012-04-05 MED ORDER — MEDROXYPROGESTERONE ACETATE 150 MG/ML IM SUSP
150.0000 mg | Freq: Once | INTRAMUSCULAR | Status: AC
  Administered 2012-04-05: 150 mg via INTRAMUSCULAR
  Filled 2012-04-05: qty 1

## 2012-04-05 MED ORDER — IBUPROFEN 200 MG PO TABS: 600.0000 mg | ORAL_TABLET | Freq: Four times a day (QID) | ORAL | Status: AC | PRN

## 2012-04-05 MED ORDER — FERROUS GLUCONATE 216 MG PO TABS: 216.0000 mg | ORAL_TABLET | Freq: Two times a day (BID) | ORAL | Status: DC

## 2012-04-05 NOTE — Clinical Social Work Maternal (Signed)
    LATE ENTRY FROM 04/04/12:  Clinical Social Work Department PSYCHOSOCIAL ASSESSMENT - MATERNAL/CHILD 04/05/2012  Patient:  Judy Harris, Judy Harris  Account Number:  [id number]  Admit Date:  04/03/2012  Marjo Bicker Name:   Esmeralda Links    Clinical Social Worker:  Andy Gauss   Date/Time:  04/04/2012 12:00 N  Date Referred:  04/04/2012   Referral source  CN     Referred reason  [facility]   Other referral source:    I:  FAMILY / HOME ENVIRONMENT Child's legal guardian:  PARENT  Guardian - Name Guardian - Age Guardian - Address  Judy [place] [address], [place] [address]  Orion Crook 22    Other household support members/support persons Name Relationship DOB   SON 63 years old   Other support:    II  PSYCHOSOCIAL DATA Information Source:  Patient Interview  Event organiser Employment:   Financial resources:  [employer] If Medicaid - County:  [place] Other  Sales executive  WIC   School / Grade:   Maternity Care Coordinator / Child Services Coordination / Early Interventions:  Cultural issues impacting care:    III  STRENGTHS Strengths  Adequate Resources  Home prepared for Child (including basic supplies)  Supportive family/friends   Strength comment:    IV  RISK FACTORS AND CURRENT PROBLEMS Current Problem:  YES   Risk Factor & Current Problem Patient Issue Family Issue Risk Factor / Current Problem Comment  Other - See comment Y N LPNC@36  weeks    V  SOCIAL WORK ASSESSMENT Pt did not seek PNC sooner than 36 weeks because she was ambivalent about caring the child to term.  Pt told Sw that she decided to parent around her 3rd month of pregnancy but could not seek PNC due to lack of insurance.  While pt was waiting on Medicaid benefits, she sought care at the pregnancy care centers & [facility]U.  Pt told Sw that she never thought about adoption and is committed to parenting this child.  She denies any illegal substance use and verbalized  understanding of hospital drug testing policy.  UDS is negative, meconium results are pending.  She all the necessary supplies and good support.  FOB is involved, as per pt.  Sw will continue to monitor drug screen results and make a referral if needed.      VI SOCIAL WORK PLAN Social Work Plan  No Further Intervention Required / No Barriers to Discharge   Type of pt/family education:   If child protective services report - county:   If child protective services report - date:   Information/referral to community resources comment:   Other social work plan:

## 2012-04-06 LAB — TYPE AND SCREEN
ABO/RH(D): O POS
Unit division: 0

## 2012-04-14 NOTE — Discharge Summary (Signed)
  mothermothermothermother Obstetric Discharge Summary Reason for Admission: induction of labor and for postdates at [redacted]w[redacted]d Prenatal Procedures: ultrasound Intrapartum Procedures: spontaneous vaginal delivery and GBS prophylaxis Postpartum Procedures: none Complications-Operative and Postpartum: none    Hemoglobin  Date Value Range Status  04/03/2012 7.5* 12.0 - 15.0 g/dL Final     HCT  Date Value Range Status  04/03/2012 26.8* 36.0 - 46.0 % Final    Hospital Course:  Hospital Course: Admitted in the evening for IOL. pos GBS. Pt received 1 dose of cytotec PV, and had FHR decels, recovered w position changes, she then continued to progress, and had a pattern of uterine tachysystole and rcv'd 1 dose of terbutaline SQ, she then rcv'd and epidural and Progressed to fully dilated. Delivery was performed by D.Connye Burkitt, CNM without difficulty. Patient and baby tolerated the procedure without difficulty, with a 1st degree laceration noted. Infant to FTN. Mother and infant then had an uncomplicated postpartum course, with bottle feeding going well. On PPD day 1, pt did refuse to have blood drawn for CBC and also refused orthostatic vital signs, she was ambulating about room without syncope. Mom's physical exam was WNL, and she was discharged home in stable condition. Contraception plan was depo provera on day of d/c and mirena in 6wks.  She received adequate benefit from po pain medications.  Discharge Diagnoses: Term Pregnancy-delivered and anemia  Discharge Information: Date: 04/14/2012 Activity: pelvic rest Diet: routine, and iron-rich diet  Medications:    Medication List     As of 04/14/2012 12:24 PM    START taking these medications         ferrous gluconate 216 MG tablet   Commonly known as: FERGON   Take 1 tablet (216 mg total) by mouth 2 (two) times daily with a meal.      ibuprofen 200 MG tablet   Commonly known as: ADVIL,MOTRIN   Take 3 tablets (600 mg total) by mouth every 6 (six)  hours as needed for pain.      CONTINUE taking these medications         prenatal multivitamin Tabs      STOP taking these medications         IRON PO          Where to get your medications    These are the prescriptions that you need to pick up. We sent them to a specific pharmacy, so you will need to go there to get them.   [id number] - [place], [place] - [address]    [address] [place] [address]    Phone: [phone]        ferrous gluconate 216 MG tablet   ibuprofen 200 MG tablet           Condition: Stable Instructions: per [facility] routine  Discharge to: Home    Follow-up Information    Follow up with [facility] in 5 weeks.         Newborn Data: Live born  Information for the patient's newborn:  Haislee, Corso [[id number]]  female ; APGAR  9, 9, ; weight ; 6#7oz  Home with Mother  Malissa Hippo 04/14/2012, 12:24 PM

## 2012-05-14 ENCOUNTER — Ambulatory Visit: Payer: Medicaid Other | Admitting: Obstetrics and Gynecology

## 2012-06-25 ENCOUNTER — Other Ambulatory Visit: Payer: Medicaid Other

## 2012-07-12 ENCOUNTER — Encounter: Payer: Medicaid Other | Admitting: Obstetrics and Gynecology

## 2012-08-01 NOTE — L&D Delivery Note (Signed)
I was present for delivery and agree with note above. MUHAMMAD,Eean Buss  

## 2012-08-01 NOTE — L&D Delivery Note (Signed)
Delivery Note At 12:04 PM a viable and healthy female was delivered via Vaginal, Spontaneous Delivery (Presentation: Left Occiput Anterior).  APGAR: 9, 9; weight .   Placenta status: Intact, Spontaneous.  Cord: 3 vessels with the following complications: Short.   Anesthesia: Epidural  Episiotomy: None Lacerations: None Suture Repair: Not done Est. Blood Loss (mL): 400  Mom to postpartum.  Baby to Couplet care / Skin to Skin.  Hazeline Junker 06/18/2013, 12:20 PM

## 2013-03-14 ENCOUNTER — Encounter (HOSPITAL_COMMUNITY): Payer: Self-pay | Admitting: *Deleted

## 2013-03-14 ENCOUNTER — Inpatient Hospital Stay (HOSPITAL_COMMUNITY): Payer: Self-pay

## 2013-03-14 ENCOUNTER — Inpatient Hospital Stay (HOSPITAL_COMMUNITY)
Admission: AD | Admit: 2013-03-14 | Discharge: 2013-03-14 | Disposition: A | Discharge status: Home / Self Care | Payer: Self-pay | Source: Ambulatory Visit | Attending: Family Medicine | Admitting: Family Medicine

## 2013-03-14 DIAGNOSIS — F411 Generalized anxiety disorder: Secondary | ICD-10-CM | POA: Insufficient documentation

## 2013-03-14 DIAGNOSIS — R0602 Shortness of breath: Secondary | ICD-10-CM | POA: Insufficient documentation

## 2013-03-14 DIAGNOSIS — O0932 Supervision of pregnancy with insufficient antenatal care, second trimester: Secondary | ICD-10-CM

## 2013-03-14 DIAGNOSIS — O99340 Other mental disorders complicating pregnancy, unspecified trimester: Secondary | ICD-10-CM | POA: Insufficient documentation

## 2013-03-14 LAB — URINALYSIS, ROUTINE W REFLEX MICROSCOPIC
Bilirubin Urine: NEGATIVE
Glucose, UA: NEGATIVE mg/dL
Ketones, ur: NEGATIVE mg/dL
pH: 6.5 (ref 5.0–8.0)

## 2013-03-14 LAB — WET PREP, GENITAL
Trich, Wet Prep: NONE SEEN
Yeast Wet Prep HPF POC: NONE SEEN

## 2013-03-14 LAB — OB RESULTS CONSOLE GC/CHLAMYDIA: Chlamydia: NEGATIVE

## 2013-03-14 NOTE — MAU Provider Note (Signed)
History     CSN: [id number]  Arrival date and time: 03/14/13 1633   None     Chief Complaint  Patient presents with  . Anxiety   HPI Judy [place] is a 27 y.o. [id number] who presents today complaining of anxiety.  She reports that her last episode of anxiety began around 2pm today and lasted just a few minutes.  She reports having SOB and having to "slump down," during the episode.  She reports no other associated symptoms. Nothing makes the episodes worse, while "relaxing," does make it better.  She reports that she would like an ultrasound today in order to date her current pregnancy.  Her last menstrual period is unclear, occuring some time at the end of April or May.  She had depo provera shots previous to her last menstrual period.  She has not established any prenatal care for this pregnancy.  She denies tobacco and illicit drugs.  She has the occasional glass of wine.  She reports being in a monogamous relationship. Pt denies spotting or bleeding or vaginal discharge. Pt is anxious about having a pregnancy so close to infant 60 months old.  Obstetric History   G4   P2   T1   P1   A1   TAB0   SAB0   E1   M0   L2       Past Medical History  Diagnosis Date  . Anemia     Has taken Iron supp;is currently taking now  . Infection     Yeast inf;not freq  . 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 Infection     UTI;no freq  . SOB (shortness of breath) 2013    Since the preg;SOB @ times    Past Surgical History  Procedure Laterality Date  . Ectopic pregnancy surgery      Family History  Problem Relation Age of Onset  . Diabetes Maternal Grandmother   . Hypertension Maternal Grandmother   . Stroke Maternal Grandmother   . Hypertension Paternal Grandmother   . Diabetes Paternal Grandmother   . Heart disease Paternal Grandmother   . Cancer Paternal Aunt     Breast    History  Substance Use Topics  . Smoking status: Never Smoker   . Smokeless tobacco: Never Used  .  Alcohol Use: Yes     Comment: Wine occasionally    Allergies: No Known Allergies  Prescriptions prior to admission  Medication Sig Dispense Refill  . [DISCONTINUED] ferrous gluconate (FERGON) 216 MG tablet Take 1 tablet (216 mg total) by mouth 2 (two) times daily with a meal.  60 tablet  2  . [DISCONTINUED] Prenatal Vit-Fe Fumarate-FA (PRENATAL MULTIVITAMIN) TABS Take 1 tablet by mouth every morning.        Review of Systems  Constitutional: Negative for fever, chills and weight loss.  Eyes: Negative for blurred vision, double vision and photophobia.  Respiratory: Negative for cough.   Cardiovascular: Negative for chest pain and palpitations.  Gastrointestinal: Negative for heartburn, nausea, vomiting, abdominal pain, diarrhea and constipation.  Genitourinary: Negative for dysuria, urgency, frequency and hematuria.  Skin: Negative for itching and rash.  Neurological: Negative for dizziness, tingling, sensory change, speech change and headaches.  Psychiatric/Behavioral: Negative for depression. The patient is nervous/anxious.    Physical Exam   Blood pressure 121/73, pulse 126, temperature 98.2 F (36.8 C), temperature source Oral, resp. rate 18, height 5\' 4"  (1.626 m), weight 72.122 kg (159 lb), last menstrual  period 11/29/2012.  Physical Exam  Constitutional: She is oriented to person, place, and time. She appears well-developed and well-nourished. No distress.  Eyes: Conjunctivae are normal. Pupils are equal, round, and reactive to light.  Neck: No tracheal deviation present. No thyromegaly present.  Cardiovascular: Regular rhythm.  Tachycardia present.   Pt's HR tachycardic when she arrived @126bpm  due to anxiety. Pt was normal rate when discharged home @96bpm   Respiratory: Effort normal and breath sounds normal. No apnea, not tachypneic and not bradypneic. No respiratory distress.  GI: Soft. There is no tenderness. There is no guarding.  Gravid - fundal height 21 cm; FHT with  doppler  Genitourinary: Vagina normal and uterus normal. Pelvic exam was performed with patient supine. There is no rash, tenderness, lesion or injury on the right labia. There is no rash, tenderness, lesion or injury on the left labia. No erythema, tenderness or bleeding around the vagina. No foreign body around the vagina. No signs of injury around the vagina. No vaginal discharge found.  Cervix is long and closed, NT; uterus gravid 22 week size nontender  Musculoskeletal: Normal range of motion.  Neurological: She is alert and oriented to person, place, and time. No cranial nerve deficit or sensory deficit.  Skin: Skin is warm, dry and intact. No rash noted. She is not diaphoretic. No erythema. No pallor.  Psychiatric: She has a normal mood and affect. Her speech is normal and behavior is normal. Judgment and thought content normal. Cognition and memory are normal.  Pt anxious initially about well being of infant- anxiety reduced after ultrasound    MAU Course  Procedures Results for orders placed during the hospital encounter of 03/14/13 (from the past 24 hour(s))  URINALYSIS, ROUTINE W REFLEX MICROSCOPIC     Status: Abnormal   Collection Time    03/14/13  4:45 PM      Result Value Range   Color, Urine YELLOW  YELLOW   APPearance HAZY (*) CLEAR   Specific Gravity, Urine 1.025  1.005 - 1.030   pH 6.5  5.0 - 8.0   Glucose, UA NEGATIVE  NEGATIVE mg/dL   Hgb urine dipstick NEGATIVE  NEGATIVE   Bilirubin Urine NEGATIVE  NEGATIVE   Ketones, ur NEGATIVE  NEGATIVE mg/dL   Protein, ur NEGATIVE  NEGATIVE mg/dL   Urobilinogen, UA 0.2  0.0 - 1.0 mg/dL   Nitrite NEGATIVE  NEGATIVE   Leukocytes, UA SMALL (*) NEGATIVE  URINE MICROSCOPIC-ADD ON     Status: Abnormal   Collection Time    03/14/13  4:45 PM      Result Value Range   Squamous Epithelial / LPF MANY (*) RARE   WBC, UA 0-2  <3 WBC/hpf  WET PREP, GENITAL     Status: Abnormal   Collection Time    03/14/13  6:00 PM      Result Value  Range   Yeast Wet Prep HPF POC NONE SEEN  NONE SEEN   Trich, Wet Prep NONE SEEN  NONE SEEN   Clue Cells Wet Prep HPF POC FEW (*) NONE SEEN   WBC, Wet Prep HPF POC FEW (*) NONE SEEN     Ultrasound findings:  1 fetus, estimated gestational age [redacted] weeks, 6 days.  Assessment and Plan  IUP [redacted]w[redacted]d by BPD Late to prenatal care Anxiety regarding close pregnancy spacing  Plan: -Discharge home -Anatomy scan to be performed ASAP -Recommend patient to began routine prenatal care at the clinic of her choosing- pt intends to see [facility] -  If her symptoms continue or worsen, she may come back to the MAU Pt seen with Deliah Boston , PA student and agree with assessment and management Pamelia Hoit WHNP-BC   CLARK, MICHAEL L 03/14/2013, 6:21 PM

## 2013-03-14 NOTE — MAU Note (Signed)
Pt stated she feels sometimes SOB and nauseated. Knows she is pregnant but not sure how far along. Had period end of a\April or beginning of May.

## 2013-03-14 NOTE — MAU Provider Note (Signed)
Chart reviewed and agree with management and plan.  

## 2013-03-15 LAB — GC/CHLAMYDIA PROBE AMP: CT Probe RNA: NEGATIVE

## 2013-03-18 ENCOUNTER — Other Ambulatory Visit (HOSPITAL_COMMUNITY): Payer: Self-pay | Admitting: Gynecology

## 2013-03-18 ENCOUNTER — Ambulatory Visit (HOSPITAL_COMMUNITY)
Admission: RE | Admit: 2013-03-18 | Discharge: 2013-03-18 | Disposition: A | Discharge status: Home / Self Care | Payer: Self-pay | Source: Ambulatory Visit | Attending: Gynecology | Admitting: Gynecology

## 2013-03-18 DIAGNOSIS — Z3689 Encounter for other specified antenatal screening: Secondary | ICD-10-CM | POA: Insufficient documentation

## 2013-03-18 DIAGNOSIS — O0930 Supervision of pregnancy with insufficient antenatal care, unspecified trimester: Secondary | ICD-10-CM | POA: Insufficient documentation

## 2013-03-18 DIAGNOSIS — O0932 Supervision of pregnancy with insufficient antenatal care, second trimester: Secondary | ICD-10-CM

## 2013-03-19 ENCOUNTER — Telehealth: Payer: Self-pay | Admitting: Gynecology

## 2013-06-11 ENCOUNTER — Encounter: Payer: Self-pay | Admitting: Obstetrics & Gynecology

## 2013-06-11 ENCOUNTER — Ambulatory Visit (INDEPENDENT_AMBULATORY_CARE_PROVIDER_SITE_OTHER): Payer: Self-pay | Admitting: Obstetrics & Gynecology

## 2013-06-11 VITALS — BP 118/78 | Temp 98.9°F | Wt 163.3 lb

## 2013-06-11 DIAGNOSIS — O09219 Supervision of pregnancy with history of pre-term labor, unspecified trimester: Secondary | ICD-10-CM

## 2013-06-11 DIAGNOSIS — O2390 Unspecified genitourinary tract infection in pregnancy, unspecified trimester: Secondary | ICD-10-CM

## 2013-06-11 DIAGNOSIS — O0930 Supervision of pregnancy with insufficient antenatal care, unspecified trimester: Secondary | ICD-10-CM

## 2013-06-11 DIAGNOSIS — O0933 Supervision of pregnancy with insufficient antenatal care, third trimester: Secondary | ICD-10-CM

## 2013-06-11 LAB — POCT URINALYSIS DIP (DEVICE)
Bilirubin Urine: NEGATIVE
Glucose, UA: NEGATIVE mg/dL
Hgb urine dipstick: NEGATIVE
Specific Gravity, Urine: 1.015 (ref 1.005–1.030)
Urobilinogen, UA: 1 mg/dL (ref 0.0–1.0)
pH: 7 (ref 5.0–8.0)

## 2013-06-11 NOTE — Progress Notes (Signed)
P=100 Pt. C/o of intermittent lower abdominal/pelvic pressure and contractions.  Discussed appropriate weight gain for this pregnancy and assured her she is within her range.  New OB packet given to patient.

## 2013-06-11 NOTE — Progress Notes (Signed)
Subjective:    Judy Harris is being seen today for her first obstetrical visit.  This is not a planned pregnancy. She is at [redacted]w[redacted]d gestation. Her obstetrical history is significant for later prenatal care. Relationship with FOB: significant other, living together. Patient does not intend to breast feed. Pregnancy history fully reviewed.  Menstrual History: OB History   Grav Para Term Preterm Abortions TAB SAB Ect Mult Living   4 2 1 1 1   1  2        Patient's last menstrual period was 11/29/2012.    The following portions of the patient's history were reviewed and updated as appropriate: allergies, current medications, past family history, past medical history, past social history, past surgical history and problem list.  Review of Systems Pertinent items are noted in HPI.    Objective:    BP 118/78  Temp(Src) 98.9 F (37.2 C)  Wt 163 lb 4.8 oz (74.072 kg)  LMP 11/29/2012  General Appearance:    Alert, cooperative, no distress, appears stated age                 Neck:   Supple, symmetrical, trachea midline, no adenopathy;    thyroid:  no enlargement/tenderness/nodules; no carotid   bruit or JVD  Back:     Symmetric, no curvature, ROM normal, no CVA tenderness  Lungs:     Clear to auscultation bilaterally, respirations unlabored  Chest Wall:    No tenderness or deformity   Heart:    Regular rate and rhythm, S1 and S2 normal, no murmur, rub   or gallop  Breast Exam:    No tenderness, masses, or nipple abnormality  Abdomen:     Soft, non-tender, bowel sounds active all four quadrants,    no masses, no organomegaly  Genitalia:    Normal female without lesion, discharge or tenderness     Extremities:   Extremities normal, atraumatic, no cyanosis or edema  Pulses:   2+ and symmetric all extremities  Skin:   Skin color, texture, turgor normal, no rashes or lesions            Assessment:    Pregnancy at 35 and 4/7 weeks  Pt adamant about not breast feeding Pt  undecided about contraceoption- wants info on Mirena  Plan:    Initial labs drawn. Prenatal vitamins. Problem list reviewed and updated. AFP3 discussed: too late. Role of ultrasound in pregnancy discussed; fetal survey: results reviewed. Follow up in 2 weeks.

## 2013-06-11 NOTE — Patient Instructions (Addendum)
Breastfeeding Deciding to breastfeed is one of the best choices you can make for you and your baby. A change in hormones during pregnancy causes your breast tissue to grow and increases the number and size of your milk ducts. These hormones also allow proteins, sugars, and fats from your blood supply to make breast milk in your milk-producing glands. Hormones prevent breast milk from being released before your baby is born as well as prompt milk flow after birth. Once breastfeeding has begun, thoughts of your baby, as well as his or her sucking or crying, can stimulate the release of milk from your milk-producing glands.  BENEFITS OF BREASTFEEDING For Your Baby  Your first milk (colostrum) helps your baby's digestive system function better.   There are antibodies in your milk that help your baby fight off infections.   Your baby has a lower incidence of asthma, allergies, and sudden infant death syndrome.   The nutrients in breast milk are better for your baby than infant formulas and are designed uniquely for your baby's needs.   Breast milk improves your baby's brain development.   Your baby is less likely to develop other conditions, such as childhood obesity, asthma, or type 2 diabetes mellitus.  For You   Breastfeeding helps to create a very special bond between you and your baby.   Breastfeeding is convenient. Breast milk is always available at the correct temperature and costs nothing.   Breastfeeding helps to burn calories and helps you lose the weight gained during pregnancy.   Breastfeeding makes your uterus contract to its prepregnancy size faster and slows bleeding (lochia) after you give birth.   Breastfeeding helps to lower your risk of developing type 2 diabetes mellitus, osteoporosis, and breast or ovarian cancer later in life. SIGNS THAT YOUR BABY IS HUNGRY Early Signs of Hunger  Increased alertness or activity.  Stretching.  Movement of the head from  side to side.  Movement of the head and opening of the mouth when the corner of the mouth or cheek is stroked (rooting).  Increased sucking sounds, smacking lips, cooing, sighing, or squeaking.  Hand-to-mouth movements.  Increased sucking of fingers or hands. Late Signs of Hunger  Fussing.  Intermittent crying. Extreme Signs of Hunger Signs of extreme hunger will require calming and consoling before your baby will be able to breastfeed successfully. Do not wait for the following signs of extreme hunger to occur before you initiate breastfeeding:   Restlessness.  A loud, strong cry.   Screaming. BREASTFEEDING BASICS Breastfeeding Initiation  Find a comfortable place to sit or lie down, with your neck and back well supported.  Place a pillow or rolled up blanket under your baby to bring him or her to the level of your breast (if you are seated). Nursing pillows are specially designed to help support your arms and your baby while you breastfeed.  Make sure that your baby's abdomen is facing your abdomen.   Gently massage your breast. With your fingertips, massage from your chest wall toward your nipple in a circular motion. This encourages milk flow. You may need to continue this action during the feeding if your milk flows slowly.  Support your breast with 4 fingers underneath and your thumb above your nipple. Make sure your fingers are well away from your nipple and your baby's mouth.   Stroke your baby's lips gently with your finger or nipple.   When your baby's mouth is open wide enough, quickly bring your baby to your   breast, placing your entire nipple and as much of the colored area around your nipple (areola) as possible into your baby's mouth.   More areola should be visible above your baby's upper lip than below the lower lip.   Your baby's tongue should be between his or her lower gum and your breast.   Ensure that your baby's mouth is correctly positioned  around your nipple (latched). Your baby's lips should create a seal on your breast and be turned out (everted).  It is common for your baby to suck about 2 3 minutes in order to start the flow of breast milk. Latching Teaching your baby how to latch on to your breast properly is very important. An improper latch can cause nipple pain and decreased milk supply for you and poor weight gain in your baby. Also, if your baby is not latched onto your nipple properly, he or she may swallow some air during feeding. This can make your baby fussy. Burping your baby when you switch breasts during the feeding can help to get rid of the air. However, teaching your baby to latch on properly is still the best way to prevent fussiness from swallowing air while breastfeeding. Signs that your baby has successfully latched on to your nipple:    Silent tugging or silent sucking, without causing you pain.   Swallowing heard between every 3 4 sucks.    Muscle movement above and in front of his or her ears while sucking.  Signs that your baby has not successfully latched on to nipple:   Sucking sounds or smacking sounds from your baby while breastfeeding.  Nipple pain. If you think your baby has not latched on correctly, slip your finger into the corner of your baby's mouth to break the suction and place it between your baby's gums. Attempt breastfeeding initiation again. Signs of Successful Breastfeeding Signs from your baby:   A gradual decrease in the number of sucks or complete cessation of sucking.   Falling asleep.   Relaxation of his or her body.   Retention of a small amount of milk in his or her mouth.   Letting go of your breast by himself or herself. Signs from you:  Breasts that have increased in firmness, weight, and size 1 3 hours after feeding.   Breasts that are softer immediately after breastfeeding.  Increased milk volume, as well as a change in milk consistency and color by  the 5th day of breastfeeding.   Nipples that are not sore, cracked, or bleeding. Signs That Your Randel Books is Getting Enough Milk  Wetting at least 3 diapers in a 24-hour period. The urine should be clear and pale yellow by age 64411 days.  At least 3 stools in a 24-hour period by age 64411 days. The stool should be soft and yellow.  At least 3 stools in a 24-hour period by age 644 days. The stool should be seedy and yellow.  No loss of weight greater than 10% of birth weight during the first 22 days of age.  Average weight gain of 4 7 ounces (120 210 mL) per week after age 64 days.  Consistent daily weight gain by age 60 days, without weight loss after the age of 2 weeks. After a feeding, your baby may spit up a small amount. This is common. BREASTFEEDING FREQUENCY AND DURATION Frequent feeding will help you make more milk and can prevent sore nipples and breast engorgement. Breastfeed when you feel the need to reduce  the fullness of your breasts or when your baby shows signs of hunger. This is called "breastfeeding on demand." Avoid introducing a pacifier to your baby while you are working to establish breastfeeding (the first 4 6 weeks after your baby is born). After this time you may choose to use a pacifier. Research has shown that pacifier use during the first year of a baby's life decreases the risk of sudden infant death syndrome (SIDS). Allow your baby to feed on each breast as long as he or she wants. Breastfeed until your baby is finished feeding. When your baby unlatches or falls asleep while feeding from the first breast, offer the second breast. Because newborns are often sleepy in the first few weeks of life, you may need to awaken your baby to get him or her to feed. Breastfeeding times will vary from baby to baby. However, the following rules can serve as a guide to help you ensure that your baby is properly fed:  Newborns (babies 4 weeks of age or younger) may breastfeed every 1 3  hours.  Newborns should not go longer than 3 hours during the day or 5 hours during the night without breastfeeding.  You should breastfeed your baby a minimum of 8 times in a 24-hour period until you begin to introduce solid foods to your baby at around 6 months of age. BREAST MILK PUMPING Pumping and storing breast milk allows you to ensure that your baby is exclusively fed your breast milk, even at times when you are unable to breastfeed. This is especially important if you are going back to work while you are still breastfeeding or when you are not able to be present during feedings. Your lactation consultant can give you guidelines on how long it is safe to store breast milk.  A breast pump is a machine that allows you to pump milk from your breast into a sterile bottle. The pumped breast milk can then be stored in a refrigerator or freezer. Some breast pumps are operated by hand, while others use electricity. Ask your lactation consultant which type will work best for you. Breast pumps can be purchased, but some hospitals and breastfeeding support groups lease breast pumps on a monthly basis. A lactation consultant can teach you how to hand express breast milk, if you prefer not to use a pump.  CARING FOR YOUR BREASTS WHILE YOU BREASTFEED Nipples can become dry, cracked, and sore while breastfeeding. The following recommendations can help keep your breasts moisturized and healthy:  Avoid using soap on your nipples.   Wear a supportive bra. Although not required, special nursing bras and tank tops are designed to allow access to your breasts for breastfeeding without taking off your entire bra or top. Avoid wearing underwire style bras or extremely tight bras.  Air dry your nipples for 3 4minutes after each feeding.   Use only cotton bra pads to absorb leaked breast milk. Leaking of breast milk between feedings is normal.   Use lanolin on your nipples after breastfeeding. Lanolin helps to  maintain your skin's normal moisture barrier. If you use pure lanolin you do not need to wash it off before feeding your baby again. Pure lanolin is not toxic to your baby. You may also hand express a few drops of breast milk and gently massage that milk into your nipples and allow the milk to air dry. In the first few weeks after giving birth, some women experience extremely full breasts (engorgement). Engorgement can make   your breasts feel heavy, warm, and tender to the touch. Engorgement peaks within 3 5 days after you give birth. The following recommendations can help ease engorgement:  Completely empty your breasts while breastfeeding or pumping. You may want to start by applying warm, moist heat (in the shower or with warm water-soaked hand towels) just before feeding or pumping. This increases circulation and helps the milk flow. If your baby does not completely empty your breasts while breastfeeding, pump any extra milk after he or she is finished.  Wear a snug bra (nursing or regular) or tank top for 1 2 days to signal your body to slightly decrease milk production.  Apply ice packs to your breasts, unless this is too uncomfortable for you.  Make sure that your baby is latched on and positioned properly while breastfeeding. If engorgement persists after 48 hours of following these recommendations, contact your health care provider or a Advertising copywriter. OVERALL HEALTH CARE RECOMMENDATIONS WHILE BREASTFEEDING  Eat healthy foods. Alternate between meals and snacks, eating 3 of each per day. Because what you eat affects your breast milk, some of the foods may make your baby more irritable than usual. Avoid eating these foods if you are sure that they are negatively affecting your baby.  Drink milk, fruit juice, and water to satisfy your thirst (about 10 glasses a day).   Rest often, relax, and continue to take your prenatal vitamins to prevent fatigue, stress, and anemia.  Continue  breast self-awareness checks.  Avoid chewing and smoking tobacco.  Avoid alcohol and drug use. Some medicines that may be harmful to your baby can pass through breast milk. It is important to ask your health care provider before taking any medicine, including all over-the-counter and prescription medicine as well as vitamin and herbal supplements. It is possible to become pregnant while breastfeeding. If birth control is desired, ask your health care provider about options that will be safe for your baby. SEEK MEDICAL CARE IF:   You feel like you want to stop breastfeeding or have become frustrated with breastfeeding.  You have painful breasts or nipples.  Your nipples are cracked or bleeding.  Your breasts are red, tender, or warm.  You have a swollen area on either breast.  You have a fever or chills.  You have nausea or vomiting.  You have drainage other than breast milk from your nipples.  Your breasts do not become full before feedings by the 5th day after you give birth.  You feel sad and depressed.  Your baby is too sleepy to eat well.  Your baby is having trouble sleeping.   Your baby is wetting less than 3 diapers in a 24-hour period.  Your baby has less than 3 stools in a 24-hour period.  Your baby's skin or the white part of his or her eyes becomes yellow.   Your baby is not gaining weight by 82 days of age. SEEK IMMEDIATE MEDICAL CARE IF:   Your baby is overly tired (lethargic) and does not want to wake up and feed.  Your baby develops an unexplained fever. Document Released: 07/18/2005 Document Revised: 03/20/2013 Document Reviewed: 01/09/2013 [facility] Patient Information 2014 [place], [place]. Group B Streptococcus Infection During Pregnancy Group B streptococcus (GBS) is a type of bacteria often found in healthy women. GBS usually does not cause any symptoms or harm to healthy adult women, but the bacteria can make a baby very sick if it is passed to the  baby during childbirth. GBS  is not a sexually transmitted disease (STD). GBS is different from Group A streptococcus, the bacteria that causes "strep throat." CAUSES  GBS bacteria can be found in the intestinal, reproductive, and urinary tracts of women. It can also be found in the female genital tract, most often in the vagina and rectal areas.  SYMPTOMS  In pregnancy, GBS can be in the following places:  Genital tract without symptoms.  Rectum without symptoms.  Urine with or without symptoms (asymptomatic bacteriuria).  Urinary symptoms can include pain, frequency, urgency, and blood with urination (cystitis). Pregnant women who are infected with GBS are at increased risk of:  Early (premature) labor and delivery.  Prolonged rupture of the membranes.  Infection in the following places:  Bladder.  Kidneys (pyelonephritis).  Bag of waters or placenta (chorioamnionitis).  Uterus (endometritis) after delivery. Newborns who are infected with GBS can develop:  Lung infection (pneumonia).  Blood infection (septicemia).  Infection of the lining of the brain and spinal cord (meningitis). DIAGNOSIS  Diagnosis of GBS infection is made by screening tests done when you are 35 to [redacted] weeks pregnant. The test (culture) is an easy swab of the vagina and rectum. A sample of your urine might also be checked for the bacteria. Talk with your caregiver about a plan for labor if your test shows that you carry the GBS bacteria. TREATMENT  If results of the culture are positive, showing that GBS is present, you likely will receive treatment with antibiotic medicines during labor. This will help prevent GBS from being passed to your baby. The antibiotics work only if they are given during labor. If treatment is given earlier in pregnancy, the bacteria may regrow and be present during labor. Tell your caregiver if you are allergic to penicillin or other antibiotics. Antibiotics are also given if:    Infection of the membranes (amnionitis) is suspected.  Labor begins or there is rupture of the membranes before 37 weeks of pregnancy and there is a high risk of delivering the baby.  The mother has a past history of giving birth to an infant with GBS infection.  The culture status is unknown (culture not performed or result not available) and there is:  Fever during labor.  Preterm labor (less than 37 weeks of pregnancy).  Prolonged rupture of membranes (18 hours or more). Treatment of the mother during labor is not recommended when:  A planned cesarean delivery is done and there is no labor or ruptured membranes. This is true even if the mother tested positive for GBS.  There is a negative culture for GBS screening during the pregnancy, regardless of the risk factors during labor. The infant will receive antibiotics if the infant tested positive for GBS or has signs and symptoms that suggest GBS infection is present. It is not recommended to routinely give antibiotics to infants whose mothers received antibiotic treatment during labor. HOME CARE INSTRUCTIONS   Take all antibiotics as prescribed by your caregiver.  Only take medicine as directed by your caregiver.  Continue with prenatal visits and care.  Return for follow-up appointments and cultures.  Follow your caregiver's instructions. SEEK MEDICAL CARE IF:   You have pain with urination.  You have frequent urination.  You have blood in your urine. SEEK IMMEDIATE MEDICAL CARE IF:  You have a fever.  You have pain in the back, side, or uterus.  You have chills.  You have abdominal swelling (distension) or pain.  You have labor pains (contractions) every 10  minutes or more often.  You are leaking fluid or bleeding from your vagina.  You have pelvic pressure that feels like your baby is pushing down.  You have a low, dull backache.  You have cramps that feel like your period.  You have abdominal cramps  with or without diarrhea.  You have repeated vomiting and diarrhea.  You have trouble breathing.  You develop confusion.  You have stiffness of your body or neck. MAKE SURE YOU:   Understand these instructions.  Will watch your condition.  Will get help right away if you are not doing well or get worse. Document Released: 10/25/2007 Document Revised: 10/10/2011 Document Reviewed: 11/28/2010 [facility] Patient Information 2014 [place], [place]. Levonorgestrel intrauterine device (IUD) What is this medicine? LEVONORGESTREL IUD (LEE voe nor jes trel) is a contraceptive (birth control) device. The device is placed inside the uterus by a healthcare professional. It is used to prevent pregnancy and can also be used to treat heavy bleeding that occurs during your period. Depending on the device, it can be used for 3 to 5 years. This medicine may be used for other purposes; ask your health care provider or pharmacist if you have questions. COMMON BRAND NAME(S): Gretta Cool What should I tell my health care provider before I take this medicine? They need to know if you have any of these conditions: -abnormal Pap smear -cancer of the breast, uterus, or cervix -diabetes -endometritis -genital or pelvic infection now or in the past -have more than one sexual partner or your partner has more than one partner -heart disease -history of an ectopic or tubal pregnancy -immune system problems -IUD in place -liver disease or tumor -problems with blood clots or take blood-thinners -use intravenous drugs -uterus of unusual shape -vaginal bleeding that has not been explained -an unusual or allergic reaction to levonorgestrel, other hormones, silicone, or polyethylene, medicines, foods, dyes, or preservatives -pregnant or trying to get pregnant -breast-feeding How should I use this medicine? This device is placed inside the uterus by a health care professional. Talk to your pediatrician  regarding the use of this medicine in children. Special care may be needed. Overdosage: If you think you have taken too much of this medicine contact a poison control center or emergency room at once. NOTE: This medicine is only for you. Do not share this medicine with others. What if I miss a dose? This does not apply. What may interact with this medicine? Do not take this medicine with any of the following medications: -amprenavir -bosentan -fosamprenavir This medicine may also interact with the following medications: -aprepitant -barbiturate medicines for inducing sleep or treating seizures -bexarotene -griseofulvin -medicines to treat seizures like carbamazepine, ethotoin, felbamate, oxcarbazepine, phenytoin, topiramate -modafinil -pioglitazone -rifabutin -rifampin -rifapentine -some medicines to treat HIV infection like atazanavir, indinavir, lopinavir, nelfinavir, tipranavir, ritonavir -St. John's wort -warfarin This list may not describe all possible interactions. Give your health care provider a list of all the medicines, herbs, non-prescription drugs, or dietary supplements you use. Also tell them if you smoke, drink alcohol, or use illegal drugs. Some items may interact with your medicine. What should I watch for while using this medicine? Visit your doctor or health care professional for regular check ups. See your doctor if you or your partner has sexual contact with others, becomes HIV positive, or gets a sexual transmitted disease. This product does not protect you against HIV infection (AIDS) or other sexually transmitted diseases. You can check the placement of the IUD yourself  by reaching up to the top of your vagina with clean fingers to feel the threads. Do not pull on the threads. It is a good habit to check placement after each menstrual period. Call your doctor right away if you feel more of the IUD than just the threads or if you cannot feel the threads at all. The  IUD may come out by itself. You may become pregnant if the device comes out. If you notice that the IUD has come out use a backup birth control method like condoms and call your health care provider. Using tampons will not change the position of the IUD and are okay to use during your period. What side effects may I notice from receiving this medicine? Side effects that you should report to your doctor or health care professional as soon as possible: -allergic reactions like skin rash, itching or hives, swelling of the face, lips, or tongue -fever, flu-like symptoms -genital sores -high blood pressure -no menstrual period for 6 weeks during use -pain, swelling, warmth in the leg -pelvic pain or tenderness -severe or sudden headache -signs of pregnancy -stomach cramping -sudden shortness of breath -trouble with balance, talking, or walking -unusual vaginal bleeding, discharge -yellowing of the eyes or skin Side effects that usually do not require medical attention (report to your doctor or health care professional if they continue or are bothersome): -acne -breast pain -change in sex drive or performance -changes in weight -cramping, dizziness, or faintness while the device is being inserted -headache -irregular menstrual bleeding within first 3 to 6 months of use -nausea This list may not describe all possible side effects. Call your doctor for medical advice about side effects. You may report side effects to FDA at 1-800-FDA-1088. Where should I keep my medicine? This does not apply. NOTE: This sheet is a summary. It may not cover all possible information. If you have questions about this medicine, talk to your doctor, pharmacist, or health care provider.  2014, [employer]. (2011-08-18 13:54:04)

## 2013-06-12 ENCOUNTER — Encounter: Payer: Self-pay | Admitting: Obstetrics & Gynecology

## 2013-06-12 DIAGNOSIS — O99013 Anemia complicating pregnancy, third trimester: Secondary | ICD-10-CM | POA: Insufficient documentation

## 2013-06-12 LAB — OBSTETRIC PANEL
Basophils Absolute: 0 10*3/uL (ref 0.0–0.1)
Basophils Relative: 0 % (ref 0–1)
HCT: 24.8 % — ABNORMAL LOW (ref 36.0–46.0)
Hemoglobin: 7.4 g/dL — ABNORMAL LOW (ref 12.0–15.0)
Hepatitis B Surface Ag: NEGATIVE
MCH: 18 pg — ABNORMAL LOW (ref 26.0–34.0)
MCHC: 29.8 g/dL — ABNORMAL LOW (ref 30.0–36.0)
Monocytes Relative: 11 % (ref 3–12)
Neutro Abs: 5.3 10*3/uL (ref 1.7–7.7)
Neutrophils Relative %: 66 % (ref 43–77)
RDW: 20.8 % — ABNORMAL HIGH (ref 11.5–15.5)

## 2013-06-12 LAB — PRESCRIPTION MONITORING PROFILE (19 PANEL)
Amphetamine/Meth: NEGATIVE ng/mL
Barbiturate Screen, Urine: NEGATIVE ng/mL
Carisoprodol, Urine: NEGATIVE ng/mL
Creatinine, Urine: 109.33 mg/dL (ref >20.0)
Fentanyl, Ur: NEGATIVE ng/mL
Meperidine, Ur: NEGATIVE ng/mL
Nitrites, Initial: NEGATIVE ug/mL
Opiate Screen, Urine: NEGATIVE ng/mL
Oxycodone Screen, Ur: NEGATIVE ng/mL
Propoxyphene: NEGATIVE ng/mL
Tapentadol, urine: NEGATIVE ng/mL
Tramadol Scrn, Ur: NEGATIVE ng/mL
Zolpidem, Urine: NEGATIVE ng/mL
pH, Initial: 6.8 pH (ref 4.5–8.9)

## 2013-06-12 LAB — ALCOHOL METABOLITE (ETG), URINE: Ethyl Glucuronide (EtG): NEGATIVE ng/mL

## 2013-06-13 ENCOUNTER — Encounter: Payer: Medicaid Other | Admitting: Family

## 2013-06-15 LAB — CULTURE, BETA STREP (GROUP B ONLY)

## 2013-06-17 ENCOUNTER — Encounter: Payer: Self-pay | Admitting: Obstetrics & Gynecology

## 2013-06-17 ENCOUNTER — Encounter (HOSPITAL_COMMUNITY): Payer: Self-pay | Admitting: *Deleted

## 2013-06-17 ENCOUNTER — Inpatient Hospital Stay (HOSPITAL_COMMUNITY)
Admission: AD | Admit: 2013-06-17 | Discharge: 2013-06-20 | DRG: 775 | Disposition: A | Discharge status: Home / Self Care | Payer: Medicaid Other | Source: Ambulatory Visit | Attending: Family Medicine | Admitting: Family Medicine

## 2013-06-17 DIAGNOSIS — Z22330 Carrier of Group B streptococcus: Secondary | ICD-10-CM

## 2013-06-17 DIAGNOSIS — O4290 Premature rupture of membranes, unspecified as to length of time between rupture and onset of labor, unspecified weeks of gestation: Principal | ICD-10-CM | POA: Diagnosis present

## 2013-06-17 DIAGNOSIS — O9902 Anemia complicating childbirth: Secondary | ICD-10-CM | POA: Diagnosis present

## 2013-06-17 DIAGNOSIS — O99013 Anemia complicating pregnancy, third trimester: Secondary | ICD-10-CM

## 2013-06-17 DIAGNOSIS — D649 Anemia, unspecified: Secondary | ICD-10-CM | POA: Diagnosis present

## 2013-06-17 DIAGNOSIS — O99892 Other specified diseases and conditions complicating childbirth: Secondary | ICD-10-CM | POA: Diagnosis present

## 2013-06-17 DIAGNOSIS — O0930 Supervision of pregnancy with insufficient antenatal care, unspecified trimester: Secondary | ICD-10-CM

## 2013-06-17 MED ORDER — PENICILLIN G POTASSIUM 5000000 UNITS IJ SOLR
2.5000 10*6.[IU] | INTRAMUSCULAR | Status: DC
  Administered 2013-06-18 (×2): 2.5 10*6.[IU] via INTRAVENOUS
  Filled 2013-06-17 (×7): qty 2.5

## 2013-06-17 MED ORDER — PENICILLIN G POTASSIUM 5000000 UNITS IJ SOLR
5.0000 10*6.[IU] | Freq: Once | INTRAVENOUS | Status: AC
  Administered 2013-06-18: 5 10*6.[IU] via INTRAVENOUS
  Filled 2013-06-17: qty 5

## 2013-06-17 NOTE — MAU Note (Addendum)
IN B-ROOM   PT SAYS  SHE WENT TO B-ROOM AT  HOME-  BUT FELT PRESSURE- AND FLUID  CONTINUED TO COME..-- AT 1015PM.    GETS [facility] -  DOWNSTAIRS-  THUSDAY- VE  3 CM.    DENIES HSV AND MRSA.

## 2013-06-17 NOTE — H&P (Signed)
Judy Harris is a 27 y.o. female [id number] at [redacted]w[redacted]d by 22w [place], presenting with PPROM. Pt reports a large gush of fluid while getting up to use the bathroom around 2215 on 11/17 and presented immediately to the MAU. Denies vaginal bleeding/discharge. Endorses good fetal movement. No headache, change in vision, swelling, SOB, abd or chest pain, N/V, fever/chillls. Pt is somewhat anxious, though; states "I've been worrying about her (the baby), but I don't know anything in particular I'm worried about."  Of note, pt is late to care; was seen in MAU in August and planned to see [facility], but eventually was seen at [facility] for initial visit on 11/11 (around 35w). GBS positive but otherwise no known complications. Pt reports a hx of preterm labor (her 11yo son delivered at around 35-36 weeks). She has also had ectopic pregnancy in the past corrected by surgery, about 6 years ago.  Maternal Medical History:  Reason for admission: Rupture of membranes.   Contractions: Onset was 1-2 hours ago.   Frequency: rare.    Fetal activity: Perceived fetal activity is normal.   Last perceived fetal movement was within the past hour.    Prenatal complications: No bleeding, HIV, pre-eclampsia or preterm labor.   PPROM  Prenatal Complications - Diabetes: none.    OB History   Grav Para Term Preterm Abortions TAB SAB Ect Mult Living   4 2 1 1 1   1  2      Past Medical History  Diagnosis Date  . Anemia     Has taken Iron supp;is currently taking now  . Infection     Yeast inf;not freq  . 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 Infection     UTI;no freq  . SOB (shortness of breath) 2013    Since the preg;SOB @ times   Past Surgical History  Procedure Laterality Date  . Ectopic pregnancy surgery     Family History: family history includes Cancer in her paternal aunt; Diabetes in her maternal grandmother and paternal grandmother; Heart disease in her paternal grandmother; Hypertension in her maternal grandmother  and paternal grandmother; Stroke in her maternal grandmother. Social History:  reports that she has never smoked. She has never used smokeless tobacco. She reports that she drinks alcohol. She reports that she does not use illicit drugs.   Prenatal Transfer Tool  Maternal Diabetes: No Genetic Screening: Declined (too late) Maternal Ultrasounds/Referrals: Normal Fetal Ultrasounds or other Referrals:  None Maternal Substance Abuse:  No Significant Maternal Medications:  None Significant Maternal Lab Results:  Lab values include: Group B Strep positive Other Comments:  Late prenatal care, started at 35w (06/11/13)  Review of Systems  Genitourinary:       ROM  All other systems reviewed and are negative.   Dilation: 4.5 Effacement (%): 70 Station: -1 Exam by:: Sherlyn Hay, RN Blood pressure 132/75, pulse 134, temperature 98.4 F (36.9 C), temperature source Oral, resp. rate 18, height 5\' 3"  (1.6 m), weight 74.9 kg (165 lb 2 oz), last menstrual period 11/29/2012. Maternal Exam:  Uterine Assessment: Contraction frequency is rare.   Abdomen: Estimated fetal weight is 6lb .    Introitus: Normal vulva. Normal vagina.  Ferning test: positive.  Nitrazine test: not done. Amniotic fluid character: clear.  Pelvis: adequate for delivery.   Cervix: Cervix evaluated by digital exam.   Dilation: 4.5 Effacement (%): 70 Cervical Position: Posterior Station: -1 Presentation: Vertex Exam by:: Sherlyn Hay, RN  Fetal Exam Fetal  Monitor Review: Mode: ultrasound.   Baseline rate: 145.  Variability: moderate (6-25 bpm).   Pattern: accelerations present and no decelerations.    Fetal State Assessment: Category I - tracings are normal.     Physical Exam  Nursing note and vitals reviewed. Constitutional: She is oriented to person, place, and time. She appears well-developed and well-nourished. No distress.  HENT:  Head: Normocephalic and atraumatic.  Eyes: EOM are normal.  Neck: Normal  range of motion. Neck supple. No thyromegaly present.  Cardiovascular: Normal rate, regular rhythm and normal heart sounds.   No murmur heard. Respiratory: Effort normal and breath sounds normal. No respiratory distress.  GI: Soft. Bowel sounds are normal. There is no tenderness.  Gravid  Musculoskeletal: Normal range of motion. She exhibits no edema.  Neurological: She is alert and oriented to person, place, and time. No cranial nerve deficit.  Skin: Skin is warm and dry. She is not diaphoretic. No erythema.  Psychiatric: She has a normal mood and affect. Her behavior is normal.    Prenatal labs: ABO, Rh: O/POS/-- (11/11 1108) Antibody: NEG (11/11 1108) Rubella: 1.30 (11/11 1108) RPR: NON REAC (11/11 1108)  HBsAg: NEGATIVE (11/11 1108)  HIV: NON REACTIVE (11/11 1108)  GBS:   POSITIVE  Assessment/Plan: -SIUP female fetus at [redacted]w[redacted]d by 22w [place], presenting with PPROM -expectant management for now, consider Pitocin if labor does not spontaneously start -GC/Chlamydia and RPR sent -Penicillin for GBS POS -planning epidural -planning bottle feeding and Depo for birth control  Above discussed in its entirety with Artelia Laroche, CNM.  Bobbye Morton, MD PGY-2, [facility] 06/18/2013, 12:58 AM  Seen and agree with note Aviva Signs, CNM

## 2013-06-18 ENCOUNTER — Inpatient Hospital Stay (HOSPITAL_COMMUNITY): Payer: Medicaid Other | Admitting: Anesthesiology

## 2013-06-18 ENCOUNTER — Encounter (HOSPITAL_COMMUNITY): Payer: Self-pay | Admitting: Anesthesiology

## 2013-06-18 ENCOUNTER — Telehealth: Payer: Self-pay

## 2013-06-18 ENCOUNTER — Encounter (HOSPITAL_COMMUNITY): Payer: Self-pay | Admitting: Emergency Medicine

## 2013-06-18 DIAGNOSIS — O9989 Other specified diseases and conditions complicating pregnancy, childbirth and the puerperium: Secondary | ICD-10-CM

## 2013-06-18 DIAGNOSIS — O9902 Anemia complicating childbirth: Secondary | ICD-10-CM

## 2013-06-18 DIAGNOSIS — O4290 Premature rupture of membranes, unspecified as to length of time between rupture and onset of labor, unspecified weeks of gestation: Secondary | ICD-10-CM

## 2013-06-18 LAB — CBC
HCT: 26 % — ABNORMAL LOW (ref 36.0–46.0)
Hemoglobin: 7.6 g/dL — ABNORMAL LOW (ref 12.0–15.0)
MCH: 18.2 pg — ABNORMAL LOW (ref 26.0–34.0)
MCHC: 29.2 g/dL — ABNORMAL LOW (ref 30.0–36.0)
MCV: 62.4 fL — ABNORMAL LOW (ref 78.0–100.0)
RDW: 21.4 % — ABNORMAL HIGH (ref 11.5–15.5)

## 2013-06-18 MED ORDER — LANOLIN HYDROUS EX OINT: TOPICAL_OINTMENT | CUTANEOUS | Status: DC | PRN

## 2013-06-18 MED ORDER — LIDOCAINE HCL (PF) 1 % IJ SOLN
INTRAMUSCULAR | Status: DC | PRN
  Administered 2013-06-18: 4 mL
  Administered 2013-06-18 (×2): 3 mL

## 2013-06-18 MED ORDER — TERBUTALINE SULFATE 1 MG/ML IJ SOLN: 0.2500 mg | Freq: Once | INTRAMUSCULAR | Status: DC | PRN

## 2013-06-18 MED ORDER — EPHEDRINE 5 MG/ML INJ: 10.0000 mg | INTRAVENOUS | Status: DC | PRN

## 2013-06-18 MED ORDER — CITRIC ACID-SODIUM CITRATE 334-500 MG/5ML PO SOLN: 30.0000 mL | ORAL | Status: DC | PRN

## 2013-06-18 MED ORDER — LACTATED RINGERS IV SOLN
INTRAVENOUS | Status: DC
  Administered 2013-06-18: 125 mL/h via INTRAVENOUS
  Administered 2013-06-18: 10:00:00 via INTRAVENOUS

## 2013-06-18 MED ORDER — OXYCODONE-ACETAMINOPHEN 5-325 MG PO TABS
1.0000 | ORAL_TABLET | ORAL | Status: DC | PRN
  Administered 2013-06-18 – 2013-06-19 (×3): 1 via ORAL
  Filled 2013-06-18 (×3): qty 1

## 2013-06-18 MED ORDER — ACETAMINOPHEN 325 MG PO TABS: 650.0000 mg | ORAL_TABLET | ORAL | Status: DC | PRN

## 2013-06-18 MED ORDER — OXYCODONE-ACETAMINOPHEN 5-325 MG PO TABS: 1.0000 | ORAL_TABLET | ORAL | Status: DC | PRN

## 2013-06-18 MED ORDER — LIDOCAINE HCL (PF) 1 % IJ SOLN: 30.0000 mL | INTRAMUSCULAR | Status: DC | PRN

## 2013-06-18 MED ORDER — PRENATAL MULTIVITAMIN CH
1.0000 | ORAL_TABLET | Freq: Every day | ORAL | Status: DC
  Administered 2013-06-18 – 2013-06-19 (×2): 1 via ORAL
  Filled 2013-06-18 (×3): qty 1

## 2013-06-18 MED ORDER — OXYTOCIN 40 UNITS IN LACTATED RINGERS INFUSION - SIMPLE MED
1.0000 m[IU]/min | INTRAVENOUS | Status: DC
  Administered 2013-06-18: 2 m[IU]/min via INTRAVENOUS
  Administered 2013-06-18: 666 m[IU]/min via INTRAVENOUS
  Administered 2013-06-18: 4 m[IU]/min via INTRAVENOUS
  Filled 2013-06-18: qty 1000

## 2013-06-18 MED ORDER — SODIUM BICARBONATE 4.2 % IV SOLN
INTRAVENOUS | Status: DC | PRN
  Administered 2013-06-18: 25 meq via INTRAVENOUS

## 2013-06-18 MED ORDER — DIBUCAINE 1 % RE OINT
1.0000 "application " | TOPICAL_OINTMENT | RECTAL | Status: DC | PRN
  Filled 2013-06-18: qty 28

## 2013-06-18 MED ORDER — EPHEDRINE 5 MG/ML INJ
10.0000 mg | INTRAVENOUS | Status: DC | PRN
  Filled 2013-06-18: qty 4

## 2013-06-18 MED ORDER — WITCH HAZEL-GLYCERIN EX PADS: 1.0000 "application " | MEDICATED_PAD | CUTANEOUS | Status: DC | PRN

## 2013-06-18 MED ORDER — IBUPROFEN 600 MG PO TABS
600.0000 mg | ORAL_TABLET | Freq: Four times a day (QID) | ORAL | Status: DC
  Administered 2013-06-18 – 2013-06-20 (×7): 600 mg via ORAL
  Filled 2013-06-18 (×8): qty 1

## 2013-06-18 MED ORDER — LACTATED RINGERS IV SOLN: 500.0000 mL | INTRAVENOUS | Status: DC | PRN

## 2013-06-18 MED ORDER — DIPHENHYDRAMINE HCL 50 MG/ML IJ SOLN: 12.5000 mg | INTRAMUSCULAR | Status: DC | PRN

## 2013-06-18 MED ORDER — FERROUS SULFATE 325 (65 FE) MG PO TABS
325.0000 mg | ORAL_TABLET | Freq: Every day | ORAL | Status: DC
Start: 2013-06-19 — End: 2013-06-20
  Administered 2013-06-19 – 2013-06-20 (×2): 325 mg via ORAL
  Filled 2013-06-18 (×3): qty 1

## 2013-06-18 MED ORDER — PHENYLEPHRINE 40 MCG/ML (10ML) SYRINGE FOR IV PUSH (FOR BLOOD PRESSURE SUPPORT): 80.0000 ug | PREFILLED_SYRINGE | INTRAVENOUS | Status: DC | PRN

## 2013-06-18 MED ORDER — BENZOCAINE-MENTHOL 20-0.5 % EX AERO
1.0000 "application " | INHALATION_SPRAY | CUTANEOUS | Status: DC | PRN
  Filled 2013-06-18: qty 56

## 2013-06-18 MED ORDER — SIMETHICONE 80 MG PO CHEW: 80.0000 mg | CHEWABLE_TABLET | ORAL | Status: DC | PRN

## 2013-06-18 MED ORDER — ONDANSETRON HCL 4 MG/2ML IJ SOLN: 4.0000 mg | INTRAMUSCULAR | Status: DC | PRN

## 2013-06-18 MED ORDER — DIPHENHYDRAMINE HCL 25 MG PO CAPS: 25.0000 mg | ORAL_CAPSULE | Freq: Four times a day (QID) | ORAL | Status: DC | PRN

## 2013-06-18 MED ORDER — OXYTOCIN 40 UNITS IN LACTATED RINGERS INFUSION - SIMPLE MED: 62.5000 mL/h | INTRAVENOUS | Status: DC

## 2013-06-18 MED ORDER — ZOLPIDEM TARTRATE 5 MG PO TABS: 5.0000 mg | ORAL_TABLET | Freq: Every evening | ORAL | Status: DC | PRN

## 2013-06-18 MED ORDER — FENTANYL 2.5 MCG/ML BUPIVACAINE 1/10 % EPIDURAL INFUSION (WH - ANES)
14.0000 mL/h | INTRAMUSCULAR | Status: DC | PRN
  Filled 2013-06-18: qty 125

## 2013-06-18 MED ORDER — IBUPROFEN 600 MG PO TABS: 600.0000 mg | ORAL_TABLET | Freq: Four times a day (QID) | ORAL | Status: DC | PRN

## 2013-06-18 MED ORDER — LACTATED RINGERS IV SOLN: 500.0000 mL | Freq: Once | INTRAVENOUS | Status: DC

## 2013-06-18 MED ORDER — SENNOSIDES-DOCUSATE SODIUM 8.6-50 MG PO TABS
2.0000 | ORAL_TABLET | ORAL | Status: DC
  Administered 2013-06-18 – 2013-06-19 (×2): 2 via ORAL
  Filled 2013-06-18 (×2): qty 2

## 2013-06-18 MED ORDER — TETANUS-DIPHTH-ACELL PERTUSSIS 5-2.5-18.5 LF-MCG/0.5 IM SUSP
0.5000 mL | Freq: Once | INTRAMUSCULAR | Status: DC
  Filled 2013-06-18: qty 0.5

## 2013-06-18 MED ORDER — PHENYLEPHRINE 40 MCG/ML (10ML) SYRINGE FOR IV PUSH (FOR BLOOD PRESSURE SUPPORT)
80.0000 ug | PREFILLED_SYRINGE | INTRAVENOUS | Status: DC | PRN
  Filled 2013-06-18: qty 10

## 2013-06-18 MED ORDER — ONDANSETRON HCL 4 MG/2ML IJ SOLN
4.0000 mg | Freq: Four times a day (QID) | INTRAMUSCULAR | Status: DC | PRN
  Administered 2013-06-18: 4 mg via INTRAVENOUS
  Filled 2013-06-18: qty 2

## 2013-06-18 MED ORDER — OXYTOCIN BOLUS FROM INFUSION: 500.0000 mL | INTRAVENOUS | Status: DC

## 2013-06-18 MED ORDER — ONDANSETRON HCL 4 MG PO TABS: 4.0000 mg | ORAL_TABLET | ORAL | Status: DC | PRN

## 2013-06-18 NOTE — Anesthesia Procedure Notes (Signed)
Epidural Patient location during procedure: OB Start time: 06/18/2013 9:13 AM  Staffing Performed by: anesthesiologist   Preanesthetic Checklist Completed: patient identified, site marked, pre-op evaluation, timeout performed, IV checked, risks and benefits discussed and monitors and equipment checked  Epidural Patient position: sitting Prep: DuraPrep Patient monitoring: heart rate, cardiac monitor, continuous pulse ox and blood pressure Approach: midline Injection technique: LOR air  Needle:  Needle type: Tuohy  Needle gauge: 17 G Needle length: 9 cm Needle insertion depth: 8 cm Catheter type: closed end flexible Catheter size: 19 Gauge Catheter at skin depth: 13 cm Test dose: negative  Assessment Events: blood not aspirated, injection not painful, no injection resistance and no paresthesia

## 2013-06-18 NOTE — Telephone Encounter (Signed)
Called pt. To inform her of the results. Informed pt. That HPV is a sexually transmitted virus that can put you at greater risk for developing abnormal cell growth/cancer. Reassured pt. That at this time her pap smear is normal and there is no abnormal growth but that it is important to more frequently monitor and in this case have more frequent pap smears. Advised pt. To call back around September 2015 to schedule her yearly exam. Pt. Verbalized understanding and stated she had no other questions.

## 2013-06-18 NOTE — Telephone Encounter (Signed)
Message copied by Louanna Raw on Tue Jun 18, 2013  3:12 PM ------      Message from: Willodean Rosenthal      Created: Fri Jun 14, 2013  7:54 AM       Please call pt.  Her pap was normal but, her HR HPV was positive.  She needs a repeat PAP in 1 year.            Thx,      clh-S ------

## 2013-06-18 NOTE — Progress Notes (Signed)
UR completed 

## 2013-06-18 NOTE — Telephone Encounter (Signed)
Called pt. And informed her that she needs to take iron TID and that she can pick OTC pills up at any pharmacy. Pt. Verbalized understanding and stated she already has some and just needs to get better about taking them. Informed patient she should take them with every meal. Pt. Verbalized understanding and stated she had no questions.

## 2013-06-18 NOTE — Telephone Encounter (Signed)
Message copied by Louanna Raw on Tue Jun 18, 2013  1:57 PM ------      Message from: Willodean Rosenthal      Created: Wed Jun 12, 2013  1:23 PM       Please call pt.  She is anemic.  She needs to begin Ferrous Sulfate OTC tid.            Thx,      clh-S ------

## 2013-06-18 NOTE — Anesthesia Preprocedure Evaluation (Addendum)
Anesthesia Evaluation  Patient identified by MRN, date of birth, ID band Patient awake    Reviewed: Allergy & Precautions, H&P , NPO status , Patient's Chart, lab work & pertinent test results  Airway Mallampati: II TM Distance: >3 FB Neck ROM: full    Dental no notable dental hx. (+) Dental Advidsory Given   Pulmonary neg pulmonary ROS,  breath sounds clear to auscultation  Pulmonary exam normal       Cardiovascular negative cardio ROS  Rhythm:regular Rate:Normal     Neuro/Psych negative neurological ROS  negative psych ROS   GI/Hepatic negative GI ROS, Neg liver ROS,   Endo/Other  negative endocrine ROS  Renal/GU negative Renal ROS     Musculoskeletal   Abdominal Normal abdominal exam  (+)   Peds  Hematology  (+) anemia ,   Anesthesia Other Findings   Reproductive/Obstetrics negative OB ROS (+) Pregnancy                          Anesthesia Physical Anesthesia Plan  ASA: II  Anesthesia Plan: Epidural   Post-op Pain Management:    Induction:   Airway Management Planned:   Additional Equipment:   Intra-op Plan:   Post-operative Plan:   Informed Consent:   Dental Advisory Given  Plan Discussed with: Anesthesiologist  Anesthesia Plan Comments:         Anesthesia Quick Evaluation

## 2013-06-18 NOTE — Progress Notes (Signed)
   Subjective: Pt reports comfortable after epidural.  Consents to having IUPC placed.    Objective: BP 122/68  Pulse 109  Temp(Src) 98.2 F (36.8 C) (Oral)  Resp 18  Ht 5\' 3"  (1.6 m)  Wt 74.9 kg (165 lb 2 oz)  BMI 29.26 kg/m2  LMP 11/29/2012      FHT:  FHR: 140's bpm, variability: moderate,  accelerations:  Present,  decelerations:  Present intermittent late decels after epidural. UC:   regular, every 2-4 minutes SVE:   Dilation: 4 Effacement (%): 80 Station: -3 Exam by:: Roney Marion, CNM  IUPC placed without difficulty.  Labs: Lab Results  Component Value Date   WBC 7.8 06/18/2013   HGB 7.6* 06/18/2013   HCT 26.0* 06/18/2013   MCV 62.4* 06/18/2013   PLT 234 06/18/2013    Assessment / Plan: PPROM Augmentation of Labor GBS pos  Labor: Augmentation of Labor Preeclampsia:  n/a Fetal Wellbeing:  Category II Pain Control:  Epidural I/D:  GBS pos Anticipated MOD:  NSVD  [facility] 06/18/2013, 10:28 AM

## 2013-06-18 NOTE — Progress Notes (Signed)
Judy Harris is a 27 y.o. [phone] at [redacted]w[redacted]d by 22w ultrasound admitted for PPROM  Subjective: Resting comfortably, not having contractions. Denies pain, headache, change in vision.  Objective: BP 106/69  Pulse 97  Temp(Src) 98.3 F (36.8 C) (Oral)  Resp 18  Ht 5\' 3"  (1.6 m)  Wt 74.9 kg (165 lb 2 oz)  BMI 29.26 kg/m2  LMP 11/29/2012      FHT:  FHR: 135 bpm, variability: moderate,  accelerations:  Present,  decelerations:  Absent UC:   Irritation, no regular contractions Last SVE:   Dilation: 4.5 Effacement (%): 70 Station: -1 Exam by:: Sherlyn Hay, RN  Labs: Lab Results  Component Value Date   WBC 7.8 06/18/2013   HGB 7.6* 06/18/2013   HCT 26.0* 06/18/2013   MCV 62.4* 06/18/2013   PLT 234 06/18/2013    Assessment / Plan: PPROM, not yet in labor Anemia - Hb stable from previous readings (last week and last year)  Labor: Start Pitocin, minimize cervical checks Preeclampsia:  no signs or symptoms of toxicity Fetal Wellbeing:  Category I Pain Control:  planning epidural, pain meds IV in the meantime if needs I/D:  Penicillin for GBS POS Anticipated MOD:  NSVD  Bobbye Morton, MD PGY-2, [facility] 06/18/2013, 6:57 AM

## 2013-06-18 NOTE — Telephone Encounter (Signed)
Message copied by Louanna Raw on Tue Jun 18, 2013  2:01 PM ------      Message from: Willodean Rosenthal      Created: Wed Jun 12, 2013  1:23 PM       Please call pt.  She is anemic.  She needs to begin Ferrous Sulfate OTC tid.            Thx,      clh-S ------

## 2013-06-18 NOTE — Progress Notes (Addendum)
PHOENICIA PIRIE is a 27 y.o. [id number] at [redacted]w[redacted]d by 22w ultrasound admitted for PPROM  Subjective: Having occasional contractions, but nothing regular. Feels well, but is still a little anxious. Denies pain, headache, change in vision.  Objective: BP 116/76  Pulse 105  Temp(Src) 98.1 F (36.7 C) (Oral)  Resp 18  Ht 5\' 3"  (1.6 m)  Wt 74.9 kg (165 lb 2 oz)  BMI 29.26 kg/m2  LMP 11/29/2012      FHT:  FHR: 145 bpm, variability: moderate,  accelerations:  Present,  decelerations:  Absent UC:   Irritation, no regular contractions Last SVE:   Dilation: 4.5 Effacement (%): 70 Station: -1 Exam by:: Sherlyn Hay, RN  Labs: Lab Results  Component Value Date   WBC 7.8 06/18/2013   HGB 7.6* 06/18/2013   HCT 26.0* 06/18/2013   MCV 62.4* 06/18/2013   PLT 234 06/18/2013    Assessment / Plan: PPROM, not yet in labor Anemia - Hb stable from previous readings (last week and last year)  Labor: Monitor for another few hours, then likely start Pit, minimize cervical checks Preeclampsia:  no signs or symptoms of toxicity Fetal Wellbeing:  Category I Pain Control:  planning epidural, pain meds IV in the meantime if needs I/D:  Penicillin for GBS POS Anticipated MOD:  NSVD  Bobbye Morton, MD PGY-2, [facility] 06/18/2013, 3:09 AM

## 2013-06-19 NOTE — Lactation Note (Signed)
This note was copied from the chart of Judy Emonii Wienke. Lactation Consultation Note  Patient Name: Judy Harris [id number] Date: 06/19/2013 Reason for consult: Initial assessment;Other (Comment) (charting for exclusion)   Maternal Data Formula Feeding for Exclusion: Yes Reason for exclusion: Mother's choice to formula feed on admision  Feeding Feeding Type: Bottle Fed - Formula Nipple Type: Regular  LATCH Score/Interventions                      Lactation Tools Discussed/Used     Consult Status Consult Status: Complete    Lynda Rainwater 06/19/2013, 4:57 PM

## 2013-06-19 NOTE — H&P (Signed)
Chart reviewed and agree with management and plan.  

## 2013-06-19 NOTE — Progress Notes (Signed)
Post Partum Day #1  Subjective: no complaints, up ad lib, voiding, tolerating PO and + flatus, minimal abd pain with scant bleeding.   Objective: Blood pressure 112/68, pulse 94, temperature 98 F (36.7 C), temperature source Oral, resp. rate 16, height 5\' 3"  (1.6 m), weight 74.9 kg (165 lb 2 oz), last menstrual period 11/29/2012, SpO2 100.00%, unknown if currently breastfeeding.  Physical Exam:  General: alert, cooperative and no distress Lochia: appropriate Uterine Fundus: firm Incision: n/a DVT Evaluation: No evidence of DVT seen on physical exam. Negative Homan's sign. No cords or calf tenderness.   Recent Labs  06/18/13 0012  HGB 7.6*  HCT 26.0*   Declined blood draw this AM.   Assessment/Plan: Plan for discharge tomorrow, Breastfeeding and Contraception still deciding. Counseled about options, will discuss with family.    LOS: 2 days   Hazeline Junker 06/19/2013, 7:33 AM   I have seen this patient and agree with the above resident's note.  Answered additional questions about contraceptive options.  Discussed LARCs as most effective forms of birth control.   LEFTWICH-KIRBY, Jiah Bari Certified Nurse-Midwife

## 2013-06-20 MED ORDER — IBUPROFEN 600 MG PO TABS: 600.0000 mg | ORAL_TABLET | Freq: Four times a day (QID) | ORAL | Status: DC

## 2013-06-20 NOTE — Clinical Social Work Maternal (Signed)
    Clinical Social Work Department PSYCHOSOCIAL ASSESSMENT - MATERNAL/CHILD 06/20/2013  Patient:  Judy Harris, Judy Harris  Account Number:  [id number]  Admit Date:  06/17/2013  Marjo Bicker Name:   [facility]    Clinical Social Worker:  Nobie Putnam, LCSW   Date/Time:  06/20/2013 10:17 AM  Date Referred:  06/20/2013   Referral source  CN     Referred reason  [facility]   Other referral source:    I:  FAMILY / HOME ENVIRONMENT Child's legal guardian:  PARENT  Guardian - Name Guardian - Age Guardian - Address  Judy Harris [address] [place], [place] [address]  Judy Harris 23 (same as above)   Other household support members/support persons Name Relationship DOB  Judy Harris [facility] 04/13/12   SON 32 years old   Other support:    II  PSYCHOSOCIAL DATA Information Source:    Event organiser Employment:   Financial resources:  Self Pay If Medicaid - [employer]:   Clinical biochemist  WIC   School / Grade:   Maternity Care Coordinator / Child Services Coordination / Early Interventions:  Cultural issues impacting care:    III  STRENGTHS Strengths  Adequate Resources  Home prepared for Child (including basic supplies)  Supportive family/friends   Strength comment:    IV  RISK FACTORS AND CURRENT PROBLEMS Current Problem:  YES   Risk Factor & Current Problem Patient Issue Family Issue Risk Factor / Current Problem Comment  Other - See comment [facility]    V  SOCIAL WORK ASSESSMENT CSW met with pt to inquire about the reason for [facility] @ 35 weeks.  Pt told CSW that she was undecided terminating the pregnancy & therefore did not seek PNC right away.  Once she decided to parent the child, she applied for Medicaid but had to wait to receive an approval before she could establish [facility]  Medicaid still pending, per pt.  CSW explained hospital drug testing policy & pt verbalized understanding.  She denies any illegal substance.  UDS is negative, meconium results are  pending.  Pt has some supplies for the baby & plans to purchase more.  She expressed the need for additional clothing.  CSW provided pt with a bundle pack.  Pt cousin, Judy Harris was at the bedside & identified as her primary support person. CSW will continue to follow drug screen results & make a referral if needed.      VI SOCIAL WORK PLAN Social Work Plan  No Further Intervention Required / No Barriers to Discharge   Type of pt/family education:   If child protective services report - county:   If child protective services report - date:   Information/referral to community resources comment:   Other social work plan:

## 2013-06-20 NOTE — Discharge Summary (Signed)
Obstetric Discharge Summary Reason for Admission: rupture of membranes Prenatal Procedures: none Intrapartum Procedures: spontaneous vaginal delivery and GBS prophylaxis Postpartum Procedures: none Complications-Operative and Postpartum: none Hemoglobin  Date Value Range Status  06/18/2013 7.6* 12.0 - 15.0 g/dL Final     HCT  Date Value Range Status  06/18/2013 26.0* 36.0 - 46.0 % Final   Brief Hospital Course:  Judy Harris is a 27 y.o. now [id number] who arrived after PPROM at 46 and 3/7. She had been late to prenatal care, with first visit at [facility] around 35 weeks. There were no intrapartum complications, epidural anesthesia with spontaneous placenta and short, 3VC, no lacerations, EBL , immediate skin to skin. GBS antibiotics were given. Post-partum course was uneventful with successful bottle feeding and they were discharged on PPD#2. To follow up in [facility], depo for contraception.   Physical Exam:  General: alert, cooperative and no distress Lochia: appropriate Uterine Fundus: firm Incision: N/A DVT Evaluation: No evidence of DVT seen on physical exam. Negative Homan's sign. No significant calf/ankle edema.  Discharge Diagnoses: NSVD at 36 and 3/7 weeks.   Discharge Information: Date: 06/20/2013 Activity: unrestricted Diet: routine Medications: Ibuprofen, Colace and Iron Condition: stable Instructions: refer to practice specific booklet Discharge to: home Follow-up Information   Follow up with [facility]. (Call for depo today and for 6 week post-partum appt.)    Contact information:   [address] [place] [address] [phone]      Newborn Data: Live born female  Birth Weight: 5 lb 3.6 oz (2370 g) APGAR: 9, 9  Home with mother.  Judy Harris 06/20/2013, 9:20 AM  I have seen and examined this patient and I agree with the above. Judy Harris 2:16 PM 06/23/2013

## 2013-06-25 NOTE — Anesthesia Postprocedure Evaluation (Signed)
  Anesthesia Post-op Note  Patient: Judy Harris  Procedure(s) Performed: Epidural  Patient Location: PACU and Mother/Baby  Anesthesia Type:Epidural  Level of Consciousness: alert   Airway and Oxygen Therapy: Patient Spontanous Breathing  Post-op Pain: mild  Post-op Assessment: Post-op Vital signs reviewed  Post-op Vital Signs: Reviewed and stable  Complications: No apparent anesthesia complications Post op assessment done via chart review

## 2013-06-25 NOTE — Addendum Note (Signed)
Addendum created 06/25/13 8295 by Heather Roberts, MD   Modules edited: Notes Section   Notes Section:  File: [id number]

## 2013-06-26 ENCOUNTER — Encounter: Payer: Self-pay | Admitting: Family

## 2013-12-09 ENCOUNTER — Ambulatory Visit: Payer: Self-pay

## 2014-02-17 ENCOUNTER — Encounter (HOSPITAL_COMMUNITY): Payer: Self-pay | Admitting: *Deleted

## 2014-02-17 ENCOUNTER — Inpatient Hospital Stay (HOSPITAL_COMMUNITY)
Admission: AD | Admit: 2014-02-17 | Discharge: 2014-02-17 | Disposition: A | Discharge status: Home / Self Care | Payer: Medicaid Other | Source: Ambulatory Visit | Attending: Obstetrics & Gynecology | Admitting: Obstetrics & Gynecology

## 2014-02-17 DIAGNOSIS — D649 Anemia, unspecified: Secondary | ICD-10-CM | POA: Diagnosis not present

## 2014-02-17 DIAGNOSIS — Z3492 Encounter for supervision of normal pregnancy, unspecified, second trimester: Secondary | ICD-10-CM

## 2014-02-17 DIAGNOSIS — O0932 Supervision of pregnancy with insufficient antenatal care, second trimester: Secondary | ICD-10-CM

## 2014-02-17 DIAGNOSIS — O99019 Anemia complicating pregnancy, unspecified trimester: Secondary | ICD-10-CM | POA: Insufficient documentation

## 2014-02-17 DIAGNOSIS — IMO0001 Reserved for inherently not codable concepts without codable children: Secondary | ICD-10-CM

## 2014-02-17 DIAGNOSIS — O26842 Uterine size-date discrepancy, second trimester: Secondary | ICD-10-CM

## 2014-02-17 DIAGNOSIS — R0602 Shortness of breath: Secondary | ICD-10-CM | POA: Diagnosis present

## 2014-02-17 DIAGNOSIS — O99012 Anemia complicating pregnancy, second trimester: Secondary | ICD-10-CM

## 2014-02-17 DIAGNOSIS — O26849 Uterine size-date discrepancy, unspecified trimester: Secondary | ICD-10-CM | POA: Insufficient documentation

## 2014-02-17 LAB — URINALYSIS, ROUTINE W REFLEX MICROSCOPIC
Bilirubin Urine: NEGATIVE
Glucose, UA: NEGATIVE mg/dL
Hgb urine dipstick: NEGATIVE
KETONES UR: NEGATIVE mg/dL
Leukocytes, UA: NEGATIVE
NITRITE: NEGATIVE
PROTEIN: NEGATIVE mg/dL
Specific Gravity, Urine: 1.025 (ref 1.005–1.030)
Urobilinogen, UA: 1 mg/dL (ref 0.0–1.0)
pH: 7 (ref 5.0–8.0)

## 2014-02-17 LAB — CBC WITH DIFFERENTIAL/PLATELET
BASOS ABS: 0 10*3/uL (ref 0.0–0.1)
Basophils Relative: 0 % (ref 0–1)
Eosinophils Absolute: 0.2 10*3/uL (ref 0.0–0.7)
Eosinophils Relative: 3 % (ref 0–5)
HCT: 26.4 % — ABNORMAL LOW (ref 36.0–46.0)
Hemoglobin: 8 g/dL — ABNORMAL LOW (ref 12.0–15.0)
Lymphocytes Relative: 32 % (ref 12–46)
Lymphs Abs: 2.2 10*3/uL (ref 0.7–4.0)
MCH: 20.6 pg — ABNORMAL LOW (ref 26.0–34.0)
MCHC: 30.3 g/dL (ref 30.0–36.0)
MCV: 68 fL — ABNORMAL LOW (ref 78.0–100.0)
MONO ABS: 0.5 10*3/uL (ref 0.1–1.0)
Monocytes Relative: 7 % (ref 3–12)
NEUTROS ABS: 3.9 10*3/uL (ref 1.7–7.7)
NEUTROS PCT: 58 % (ref 43–77)
Platelets: 197 10*3/uL (ref 150–400)
RBC: 3.88 MIL/uL (ref 3.87–5.11)
RDW: 17.9 % — AB (ref 11.5–15.5)
WBC: 6.8 10*3/uL (ref 4.0–10.5)

## 2014-02-17 MED ORDER — FERROUS SULFATE 325 (65 FE) MG PO TABS: 325.0000 mg | ORAL_TABLET | Freq: Two times a day (BID) | ORAL | Status: DC

## 2014-02-17 MED ORDER — CONCEPT OB 130-92.4-1 MG PO CAPS: 1.0000 | ORAL_CAPSULE | Freq: Every day | ORAL | Status: DC

## 2014-02-17 NOTE — MAU Note (Signed)
Has been short of breath, off and on for a week, 'bothering her' for 2 days.  Denies pain, cough, cold symptoms or asthma.

## 2014-02-17 NOTE — MAU Provider Note (Signed)
Chief Complaint: Shortness of Breath   First Provider Initiated Contact with Patient 02/17/14 1733      SUBJECTIVE HPI: Judy Harris is a 28 y.o. [id number] at 01.7 weeks by uncertain LMP who presents with wanting to know how far along she is. She also reports mild SOB similar to later in previous pregnancies. Wants termination of pregnancy. Denies fever, chills, cough, URI Sx, Hx asthma.   Past Medical History  Diagnosis Date  . Anemia     Has taken Iron supp;is currently taking now  . Infection     Yeast inf;not freq  . 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 Infection     UTI;no freq  . SOB (shortness of breath) 2013    Since the preg;SOB @ times   OB History  Gravida Para Term Preterm AB SAB TAB Ectopic Multiple Living  5 3 1 2 1   1  3     # Outcome Date GA Lbr Len/2nd Weight Sex Delivery Anes PTL Lv  5 CUR           4 PRE 06/18/13 [redacted]w[redacted]d 13:32 / 00:17 2.37 kg (5 lb 3.6 oz) F SVD EPI  Y  3 TRM 04/03/12 [redacted]w[redacted]d 09:06 / 00:22 2.92 kg (6 lb 7 oz) M SVD EPI  Y  2 ECT 2005             Comments: Left salpingectomy  1 PRE 01/14/02 [redacted]w[redacted]d  2.24 kg (4 lb 15 oz) M SVD EPI Y Y     Comments: No complications     Past Surgical History  Procedure Laterality Date  . Ectopic pregnancy surgery     History   Social History  . Marital Status: Single    Spouse Name: N/A    Number of Children: 1  . Years of Education: 13   Occupational History  . Not on file.   Social History Main Topics  . Smoking status: Never Smoker   . Smokeless tobacco: Never Used  . Alcohol Use: Yes     Comment: Wine occasionally  . Drug Use: No  . Sexual Activity: Yes    Partners: Male    Birth Control/ Protection: None     Comment: currently pregnant   Other Topics Concern  . Not on file   Social History Narrative  . No narrative on file   No current facility-administered medications on file prior to encounter.   No current outpatient prescriptions on file prior to encounter.   No Known  Allergies  ROS: Pertinent items in HPI.   OBJECTIVE Blood pressure 116/75, pulse 99, temperature 99.5 F (37.5 C), temperature source Oral, resp. rate 16, height 5\' 1"  (1.549 m), weight 69.854 kg (154 lb), last menstrual period 10/27/2013, SpO2 100.00%, unknown if currently breastfeeding. GENERAL: Well-developed, well-nourished female in no acute distress.  HEENT: Normocephalic HEART: normal rate and rhythm. No M/R/G RESP: normal effort. CTAB ABDOMEN: Soft, non-tender. Fundal height 24 cm.  EXTREMITIES: Nontender, no edema NEURO: Alert and oriented SPECULUM EXAM: deferred. Fetal heart rate 161.  LAB RESULTS Results for orders placed during the hospital encounter of 02/17/14 (from the past 24 hour(s))  URINALYSIS, ROUTINE W REFLEX MICROSCOPIC     Status: None   Collection Time    02/17/14  4:20 PM      Result Value Ref Range   Color, Urine YELLOW  YELLOW   APPearance CLEAR  CLEAR   Specific Gravity, Urine 1.025  1.005 - 1.030  pH 7.0  5.0 - 8.0   Glucose, UA NEGATIVE  NEGATIVE mg/dL   Hgb urine dipstick NEGATIVE  NEGATIVE   Bilirubin Urine NEGATIVE  NEGATIVE   Ketones, ur NEGATIVE  NEGATIVE mg/dL   Protein, ur NEGATIVE  NEGATIVE mg/dL   Urobilinogen, UA 1.0  0.0 - 1.0 mg/dL   Nitrite NEGATIVE  NEGATIVE   Leukocytes, UA NEGATIVE  NEGATIVE  CBC WITH DIFFERENTIAL     Status: Abnormal   Collection Time    02/17/14  4:48 PM      Result Value Ref Range   WBC 6.8  4.0 - 10.5 K/uL   RBC 3.88  3.87 - 5.11 MIL/uL   Hemoglobin 8.0 (*) 12.0 - 15.0 g/dL   HCT 26.4 (*) 36.0 - 46.0 %   MCV 68.0 (*) 78.0 - 100.0 fL   MCH 20.6 (*) 26.0 - 34.0 pg   MCHC 30.3  30.0 - 36.0 g/dL   RDW 17.9 (*) 11.5 - 15.5 %   Platelets 197  150 - 400 K/uL   Neutrophils Relative % 58  43 - 77 %   Lymphocytes Relative 32  12 - 46 %   Monocytes Relative 7  3 - 12 %   Eosinophils Relative 3  0 - 5 %   Basophils Relative 0  0 - 1 %   Neutro Abs 3.9  1.7 - 7.7 K/uL   Lymphs Abs 2.2  0.7 - 4.0 K/uL    Monocytes Absolute 0.5  0.1 - 1.0 K/uL   Eosinophils Absolute 0.2  0.0 - 0.7 K/uL   Basophils Absolute 0.0  0.0 - 0.1 K/uL   Smear Review MORPHOLOGY UNREMARKABLE      IMAGING No results found.  MAU COURSE  ASSESSMENT 1. Anemia of mother in pregnancy, antepartum, second trimester   2. Uterine size date discrepancy pregnancy, second trimester    PLAN Discharge home in stable condition. Increase dietary iron.      Follow-up Information   Follow up with [facility]. (Will call to schedule an OB appointment)    Contact information:   [address] [place] [address]       Follow up with [facility]. (As needed in emergencies)    Contact information:   [address] [id number] [place] [address] [phone]      Follow up with [facility]. (Will call you to schedule your anatomy ultrasound)    Specialty:  Radiology   Contact information:   [address] [id number] mc  [place] [address] [phone]       Medication List         CONCEPT OB 130-92.4-1 MG Caps  Take 1 tablet by mouth daily.     ferrous sulfate 325 (65 FE) MG tablet  Take 1 tablet (325 mg total) by mouth 2 (two) times daily with a meal.       Manya Silvas, CNM 02/17/2014  6:20 PM

## 2014-02-17 NOTE — Discharge Instructions (Signed)
Second Trimester of Pregnancy °The second trimester is from week 13 through week 28, months 4 through 6. The second trimester is often a time when you feel your best. Your body has also adjusted to being pregnant, and you begin to feel better physically. Usually, morning sickness has lessened or quit completely, you may have more energy, and you may have an increase in appetite. The second trimester is also a time when the fetus is growing rapidly. At the end of the sixth month, the fetus is about 9 inches long and weighs about 1½ pounds. You will likely begin to feel the baby move (quickening) between 18 and 20 weeks of the pregnancy. °BODY CHANGES °Your body goes through many changes during pregnancy. The changes vary from woman to woman.  °· Your weight will continue to increase. You will notice your lower abdomen bulging out. °· You may begin to get stretch marks on your hips, abdomen, and breasts. °· You may develop headaches that can be relieved by medicines approved by your health care provider. °· You may urinate more often because the fetus is pressing on your bladder. °· You may develop or continue to have heartburn as a result of your pregnancy. °· You may develop constipation because certain hormones are causing the muscles that push waste through your intestines to slow down. °· You may develop hemorrhoids or swollen, bulging veins (varicose veins). °· You may have back pain because of the weight gain and pregnancy hormones relaxing your joints between the bones in your pelvis and as a result of a shift in weight and the muscles that support your balance. °· Your breasts will continue to grow and be tender. °· Your gums may bleed and may be sensitive to brushing and flossing. °· Dark spots or blotches (chloasma, mask of pregnancy) may develop on your face. This will likely fade after the baby is born. °· A dark line from your belly button to the pubic area (linea nigra) may appear. This will likely fade  after the baby is born. °· You may have changes in your hair. These can include thickening of your hair, rapid growth, and changes in texture. Some women also have hair loss during or after pregnancy, or hair that feels dry or thin. Your hair will most likely return to normal after your baby is born. °WHAT TO EXPECT AT YOUR PRENATAL VISITS °During a routine prenatal visit: °· You will be weighed to make sure you and the fetus are growing normally. °· Your blood pressure will be taken. °· Your abdomen will be measured to track your baby's growth. °· The fetal heartbeat will be listened to. °· Any test results from the previous visit will be discussed. °Your health care provider may ask you: °· How you are feeling. °· If you are feeling the baby move. °· If you have had any abnormal symptoms, such as leaking fluid, bleeding, severe headaches, or abdominal cramping. °· If you have any questions. °Other tests that may be performed during your second trimester include: °· Blood tests that check for: °¨ Low iron levels (anemia). °¨ Gestational diabetes (between 24 and 28 weeks). °¨ Rh antibodies. °· Urine tests to check for infections, diabetes, or protein in the urine. °· An ultrasound to confirm the proper growth and development of the baby. °· An amniocentesis to check for possible genetic problems. °· Fetal screens for spina bifida and Down syndrome. °HOME CARE INSTRUCTIONS  °· Avoid all smoking, herbs, alcohol, and unprescribed   drugs. These chemicals affect the formation and growth of the baby.  Follow your health care provider's instructions regarding medicine use. There are medicines that are either safe or unsafe to take during pregnancy.  Exercise only as directed by your health care provider. Experiencing uterine cramps is a good sign to stop exercising.  Continue to eat regular, healthy meals.  Wear a good support bra for breast tenderness.  Do not use hot tubs, steam rooms, or saunas.  Wear your  seat belt at all times when driving.  Avoid raw meat, uncooked cheese, cat litter boxes, and soil used by cats. These carry germs that can cause birth defects in the baby.  Take your prenatal vitamins.  Try taking a stool softener (if your health care provider approves) if you develop constipation. Eat more high-fiber foods, such as fresh vegetables or fruit and whole grains. Drink plenty of fluids to keep your urine clear or pale yellow.  Take warm sitz baths to soothe any pain or discomfort caused by hemorrhoids. Use hemorrhoid cream if your health care provider approves.  If you develop varicose veins, wear support hose. Elevate your feet for 15 minutes, 3-4 times a day. Limit salt in your diet.  Avoid heavy lifting, wear low heel shoes, and practice good posture.  Rest with your legs elevated if you have leg cramps or low back pain.  Visit your dentist if you have not gone yet during your pregnancy. Use a soft toothbrush to brush your teeth and be gentle when you floss.  A sexual relationship may be continued unless your health care provider directs you otherwise.  Continue to go to all your prenatal visits as directed by your health care provider. SEEK MEDICAL CARE IF:   You have dizziness.  You have mild pelvic cramps, pelvic pressure, or nagging pain in the abdominal area.  You have persistent nausea, vomiting, or diarrhea.  You have a bad smelling vaginal discharge.  You have pain with urination. SEEK IMMEDIATE MEDICAL CARE IF:   You have a fever.  You are leaking fluid from your vagina.  You have spotting or bleeding from your vagina.  You have severe abdominal cramping or pain.  You have rapid weight gain or loss.  You have shortness of breath with chest pain.  You notice sudden or extreme swelling of your face, hands, ankles, feet, or legs.  You have not felt your baby move in over an hour.  You have severe headaches that do not go away with  medicine.  You have vision changes. Document Released: 07/12/2001 Document Revised: 07/23/2013 Document Reviewed: 09/18/2012 [facility] Patient Information 2015 [place] , [place]. This information is not intended to replace advice given to you by your health care provider. Make sure you discuss any questions you have with your health care provider.   Iron-Rich Diet An iron-rich diet contains foods that are good sources of iron. Iron is an important mineral that helps your body produce hemoglobin. Hemoglobin is a protein in red blood cells that carries oxygen to the body's tissues. Sometimes, the iron level in your blood can be low. This may be caused by:  A lack of iron in your diet.  Blood loss.  Times of growth, such as during pregnancy or during a child's growth and development. Low levels of iron can cause a decrease in the number of red blood cells. This can result in iron deficiency anemia. Iron deficiency anemia symptoms include:  Tiredness.  Weakness.  Irritability.  Increased chance  of infection. Here are some recommendations for daily iron intake:  Males older than 30 years of age need 8 mg of iron per day.  Women ages 71 to 58 need 18 mg of iron per day.  Pregnant women need 27 mg of iron per day, and women who are over 31 years of age and breastfeeding need 9 mg of iron per day.  Women over the age of 51 need 8 mg of iron per day. SOURCES OF IRON There are 2 types of iron that are found in food: heme iron and nonheme iron. Heme iron is absorbed by the body better than nonheme iron. Heme iron is found in meat, poultry, and fish. Nonheme iron is found in grains, beans, and vegetables. Heme Iron Sources Food / Iron (mg)  Chicken liver, 3 oz (85 g)/ 10 mg  Beef liver, 3 oz (85 g)/ 5.5 mg  Oysters, 3 oz (85 g)/ 8 mg  Beef, 3 oz (85 g)/ 2 to 3 mg  Shrimp, 3 oz (85 g)/ 2.8 mg  [place], 3 oz (85 g)/ 2 mg  Chicken, 3 oz (85 g) / 1 mg  Fish (tuna, halibut), 3 oz (85  g)/ 1 mg  Pork, 3 oz (85 g)/ 0.9 mg Nonheme Iron Sources Food / Iron (mg)  Ready-to-eat breakfast cereal, iron-fortified / 3.9 to 7 mg  Tofu,  cup / 3.4 mg  Kidney beans,  cup / 2.6 mg  Baked potato with skin / 2.7 mg  Asparagus,  cup / 2.2 mg  Avocado / 2 mg  Dried peaches,  cup / 1.6 mg  Raisins,  cup / 1.5 mg  Soy milk, 1 cup / 1.5 mg  Whole-wheat bread, 1 slice / 1.2 mg  Spinach, 1 cup / 0.8 mg  Broccoli,  cup / 0.6 mg IRON ABSORPTION Certain foods can decrease the body's absorption of iron. Try to avoid these foods and beverages while eating meals with iron-containing foods:  Coffee.  Tea.  Fiber.  Soy. Foods containing vitamin C can help increase the amount of iron your body absorbs from iron sources, especially from nonheme sources. Eat foods with vitamin C along with iron-containing foods to increase your iron absorption. Foods that are high in vitamin C include many fruits and vegetables. Some good sources are:  Fresh orange juice.  Oranges.  Strawberries.  Mangoes.  Grapefruit.  Red bell peppers.  Green bell peppers.  Broccoli.  Potatoes with skin.  Tomato juice. Document Released: 03/01/2005 Document Revised: 10/10/2011 Document Reviewed: 01/06/2011 [facility] Patient Information 2015 [place], [place]. This information is not intended to replace advice given to you by your health care provider. Make sure you discuss any questions you have with your health care provider.

## 2014-02-18 DIAGNOSIS — Z3492 Encounter for supervision of normal pregnancy, unspecified, second trimester: Secondary | ICD-10-CM

## 2014-02-19 NOTE — MAU Provider Note (Signed)
Attestation of Attending Supervision of Advanced Practitioner (CNM/NP): Evaluation and management procedures were performed by the Advanced Practitioner under my supervision and collaboration.  I have reviewed the Advanced Practitioner's note and chart, and I agree with the management and plan.  HARRAWAY-SMITH, Roger Fasnacht 8:38 AM

## 2014-02-26 ENCOUNTER — Ambulatory Visit (HOSPITAL_COMMUNITY)
Admission: RE | Admit: 2014-02-26 | Discharge: 2014-02-26 | Disposition: A | Discharge status: Home / Self Care | Payer: Medicaid Other | Source: Ambulatory Visit | Attending: Advanced Practice Midwife | Admitting: Advanced Practice Midwife

## 2014-02-26 DIAGNOSIS — Z3689 Encounter for other specified antenatal screening: Secondary | ICD-10-CM | POA: Diagnosis present

## 2014-02-26 DIAGNOSIS — IMO0001 Reserved for inherently not codable concepts without codable children: Secondary | ICD-10-CM

## 2014-02-26 DIAGNOSIS — O0930 Supervision of pregnancy with insufficient antenatal care, unspecified trimester: Secondary | ICD-10-CM | POA: Insufficient documentation

## 2014-02-26 DIAGNOSIS — O0932 Supervision of pregnancy with insufficient antenatal care, second trimester: Secondary | ICD-10-CM

## 2014-02-28 ENCOUNTER — Encounter: Payer: Self-pay | Admitting: Advanced Practice Midwife

## 2014-03-04 ENCOUNTER — Encounter: Payer: Self-pay | Admitting: Obstetrics and Gynecology

## 2014-03-04 ENCOUNTER — Encounter: Payer: Medicaid Other | Admitting: Obstetrics and Gynecology

## 2014-03-14 ENCOUNTER — Encounter: Payer: Self-pay | Admitting: General Practice

## 2014-04-08 ENCOUNTER — Encounter: Payer: Medicaid Other | Admitting: Obstetrics and Gynecology

## 2014-04-08 ENCOUNTER — Encounter: Payer: Self-pay | Admitting: Obstetrics and Gynecology

## 2014-05-27 ENCOUNTER — Encounter: Payer: Self-pay | Admitting: General Practice

## 2014-06-02 ENCOUNTER — Encounter (HOSPITAL_COMMUNITY): Payer: Self-pay | Admitting: *Deleted

## 2014-06-16 ENCOUNTER — Inpatient Hospital Stay (HOSPITAL_COMMUNITY)
Admission: AD | Admit: 2014-06-16 | Discharge: 2014-06-20 | DRG: 775 | Disposition: A | Discharge status: Home / Self Care | Payer: Medicaid Other | Source: Ambulatory Visit | Attending: Obstetrics and Gynecology | Admitting: Obstetrics and Gynecology

## 2014-06-16 ENCOUNTER — Inpatient Hospital Stay (HOSPITAL_COMMUNITY): Payer: Medicaid Other | Admitting: Anesthesiology

## 2014-06-16 ENCOUNTER — Encounter (HOSPITAL_COMMUNITY): Payer: Self-pay | Admitting: *Deleted

## 2014-06-16 DIAGNOSIS — D649 Anemia, unspecified: Secondary | ICD-10-CM | POA: Diagnosis present

## 2014-06-16 DIAGNOSIS — O9902 Anemia complicating childbirth: Secondary | ICD-10-CM | POA: Diagnosis present

## 2014-06-16 DIAGNOSIS — O480 Post-term pregnancy: Secondary | ICD-10-CM | POA: Diagnosis present

## 2014-06-16 DIAGNOSIS — IMO0001 Reserved for inherently not codable concepts without codable children: Secondary | ICD-10-CM

## 2014-06-16 DIAGNOSIS — Z833 Family history of diabetes mellitus: Secondary | ICD-10-CM | POA: Diagnosis not present

## 2014-06-16 DIAGNOSIS — O0933 Supervision of pregnancy with insufficient antenatal care, third trimester: Secondary | ICD-10-CM | POA: Diagnosis not present

## 2014-06-16 DIAGNOSIS — O99824 Streptococcus B carrier state complicating childbirth: Secondary | ICD-10-CM | POA: Diagnosis present

## 2014-06-16 DIAGNOSIS — Z3A41 41 weeks gestation of pregnancy: Secondary | ICD-10-CM | POA: Diagnosis present

## 2014-06-16 DIAGNOSIS — Z823 Family history of stroke: Secondary | ICD-10-CM

## 2014-06-16 DIAGNOSIS — Z3492 Encounter for supervision of normal pregnancy, unspecified, second trimester: Secondary | ICD-10-CM

## 2014-06-16 DIAGNOSIS — O471 False labor at or after 37 completed weeks of gestation: Secondary | ICD-10-CM | POA: Diagnosis present

## 2014-06-16 DIAGNOSIS — Z8249 Family history of ischemic heart disease and other diseases of the circulatory system: Secondary | ICD-10-CM

## 2014-06-16 LAB — CBC
HCT: 27.1 % — ABNORMAL LOW (ref 36.0–46.0)
HEMOGLOBIN: 8.1 g/dL — AB (ref 12.0–15.0)
MCH: 20 pg — ABNORMAL LOW (ref 26.0–34.0)
MCHC: 29.9 g/dL — AB (ref 30.0–36.0)
MCV: 66.7 fL — ABNORMAL LOW (ref 78.0–100.0)
Platelets: 180 10*3/uL (ref 150–400)
RBC: 4.06 MIL/uL (ref 3.87–5.11)
RDW: 19.9 % — ABNORMAL HIGH (ref 11.5–15.5)
WBC: 8 10*3/uL (ref 4.0–10.5)

## 2014-06-16 LAB — TYPE AND SCREEN
ABO/RH(D): O POS
Antibody Screen: NEGATIVE

## 2014-06-16 LAB — GROUP B STREP BY PCR: GROUP B STREP BY PCR: POSITIVE — AB

## 2014-06-16 MED ORDER — FENTANYL 2.5 MCG/ML BUPIVACAINE 1/10 % EPIDURAL INFUSION (WH - ANES): 14.0000 mL/h | INTRAMUSCULAR | Status: DC | PRN

## 2014-06-16 MED ORDER — LACTATED RINGERS IV SOLN
INTRAVENOUS | Status: DC
  Administered 2014-06-16: via INTRAVENOUS
  Administered 2014-06-17: 125 mL/h via INTRAVENOUS

## 2014-06-16 MED ORDER — PHENYLEPHRINE 40 MCG/ML (10ML) SYRINGE FOR IV PUSH (FOR BLOOD PRESSURE SUPPORT)
80.0000 ug | PREFILLED_SYRINGE | INTRAVENOUS | Status: DC | PRN
  Filled 2014-06-16: qty 2

## 2014-06-16 MED ORDER — OXYTOCIN BOLUS FROM INFUSION: 500.0000 mL | INTRAVENOUS | Status: DC

## 2014-06-16 MED ORDER — FENTANYL 2.5 MCG/ML BUPIVACAINE 1/10 % EPIDURAL INFUSION (WH - ANES)
14.0000 mL/h | INTRAMUSCULAR | Status: DC | PRN
  Administered 2014-06-16 – 2014-06-17 (×2): 14 mL/h via EPIDURAL
  Filled 2014-06-16 (×2): qty 125

## 2014-06-16 MED ORDER — LIDOCAINE HCL (PF) 1 % IJ SOLN
30.0000 mL | INTRAMUSCULAR | Status: DC | PRN
  Filled 2014-06-16: qty 30

## 2014-06-16 MED ORDER — LIDOCAINE HCL (PF) 1 % IJ SOLN
INTRAMUSCULAR | Status: DC | PRN
  Administered 2014-06-16: 6 mL
  Administered 2014-06-16: 4 mL

## 2014-06-16 MED ORDER — OXYCODONE-ACETAMINOPHEN 5-325 MG PO TABS: 2.0000 | ORAL_TABLET | ORAL | Status: DC | PRN

## 2014-06-16 MED ORDER — ACETAMINOPHEN 325 MG PO TABS: 650.0000 mg | ORAL_TABLET | ORAL | Status: DC | PRN

## 2014-06-16 MED ORDER — OXYTOCIN 40 UNITS IN LACTATED RINGERS INFUSION - SIMPLE MED
62.5000 mL/h | INTRAVENOUS | Status: DC
  Administered 2014-06-17: 62.5 mL/h via INTRAVENOUS
  Filled 2014-06-16: qty 1000

## 2014-06-16 MED ORDER — EPHEDRINE 5 MG/ML INJ
10.0000 mg | INTRAVENOUS | Status: DC | PRN
  Filled 2014-06-16: qty 2

## 2014-06-16 MED ORDER — OXYCODONE-ACETAMINOPHEN 5-325 MG PO TABS: 1.0000 | ORAL_TABLET | ORAL | Status: DC | PRN

## 2014-06-16 MED ORDER — ONDANSETRON HCL 4 MG/2ML IJ SOLN
4.0000 mg | Freq: Four times a day (QID) | INTRAMUSCULAR | Status: DC | PRN
  Administered 2014-06-17: 4 mg via INTRAVENOUS
  Filled 2014-06-16: qty 2

## 2014-06-16 MED ORDER — FERROUS SULFATE 325 (65 FE) MG PO TABS
325.0000 mg | ORAL_TABLET | Freq: Two times a day (BID) | ORAL | Status: DC
  Filled 2014-06-16 (×3): qty 1

## 2014-06-16 MED ORDER — DIPHENHYDRAMINE HCL 50 MG/ML IJ SOLN
12.5000 mg | INTRAMUSCULAR | Status: DC | PRN
  Administered 2014-06-17: 12.5 mg via INTRAVENOUS
  Filled 2014-06-16: qty 1

## 2014-06-16 MED ORDER — PRENATAL MULTIVITAMIN CH: 1.0000 | ORAL_TABLET | Freq: Every day | ORAL | Status: DC

## 2014-06-16 MED ORDER — LACTATED RINGERS IV SOLN
500.0000 mL | Freq: Once | INTRAVENOUS | Status: AC
  Administered 2014-06-16: 500 mL via INTRAVENOUS

## 2014-06-16 MED ORDER — PHENYLEPHRINE 40 MCG/ML (10ML) SYRINGE FOR IV PUSH (FOR BLOOD PRESSURE SUPPORT)
80.0000 ug | PREFILLED_SYRINGE | INTRAVENOUS | Status: DC | PRN
  Filled 2014-06-16: qty 10
  Filled 2014-06-16: qty 2

## 2014-06-16 MED ORDER — CITRIC ACID-SODIUM CITRATE 334-500 MG/5ML PO SOLN: 30.0000 mL | ORAL | Status: DC | PRN

## 2014-06-16 MED ORDER — LACTATED RINGERS IV SOLN: 500.0000 mL | INTRAVENOUS | Status: DC | PRN

## 2014-06-16 NOTE — Anesthesia Preprocedure Evaluation (Signed)
Anesthesia Evaluation  Patient identified by MRN, date of birth, ID band Patient awake    Reviewed: Allergy & Precautions, H&P , Patient's Chart, lab work & pertinent test results  Airway Mallampati: II  TM Distance: >3 FB Neck ROM: full    Dental  (+) Teeth Intact   Pulmonary  breath sounds clear to auscultation        Cardiovascular Rhythm:regular Rate:Normal     Neuro/Psych    GI/Hepatic   Endo/Other    Renal/GU      Musculoskeletal   Abdominal   Peds  Hematology  (+) anemia ,   Anesthesia Other Findings       Reproductive/Obstetrics (+) Pregnancy                             Anesthesia Physical Anesthesia Plan  ASA: II  Anesthesia Plan: Epidural   Post-op Pain Management:    Induction:   Airway Management Planned:   Additional Equipment:   Intra-op Plan:   Post-operative Plan:   Informed Consent: I have reviewed the patients History and Physical, chart, labs and discussed the procedure including the risks, benefits and alternatives for the proposed anesthesia with the patient or authorized representative who has indicated his/her understanding and acceptance.   Dental Advisory Given  Plan Discussed with:   Anesthesia Plan Comments: (Labs checked- platelets confirmed with RN in room. Fetal heart tracing, per RN, reported to be stable enough for sitting procedure. Discussed epidural, and patient consents to the procedure:  included risk of possible headache,backache, failed block, allergic reaction, and nerve injury. This patient was asked if she had any questions or concerns before the procedure started.)        Anesthesia Quick Evaluation

## 2014-06-16 NOTE — MAU Note (Signed)
Report given to [facility], RN may transfer pt to room 168

## 2014-06-16 NOTE — H&P (Signed)
Judy Harris is a 28 y.o. [phone] at [redacted]w[redacted]d by 2nd trimester ultrasound presenting for contractions. Clear vaginal discharge. No vaginal bleeding. No LOF. Regular contractions starting yesterday and have remained constant. Unknown duration. No prenatal care. Ultrasound at [redacted]w[redacted]d significant for right pyelectasis. No follow-up ultrasound obtained by patient.  Maternal Medical History:  Reason for admission: Contractions.   Contractions: Onset was yesterday.   Frequency: regular.    Fetal activity: Perceived fetal activity is normal.   Last perceived fetal movement was within the past hour.    Prenatal complications: No substance abuse.   Prenatal Complications - Diabetes: none.    OB History    Gravida Para Term Preterm AB TAB SAB Ectopic Multiple Living   5 3 1 2 1   1  3      Past Medical History  Diagnosis Date  . Anemia     Has taken Iron supp;is currently taking now  . Infection     Yeast inf;not freq  . 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 Infection     UTI;no freq  . SOB (shortness of breath) 2013    Since the preg;SOB @ times   Past Surgical History  Procedure Laterality Date  . Ectopic pregnancy surgery     Family History: family history includes Cancer in her paternal aunt; Diabetes in her maternal grandmother and paternal grandmother; Heart disease in her paternal grandmother; Hypertension in her maternal grandmother and paternal grandmother; Stroke in her maternal grandmother. Social History:  reports that she has never smoked. She has never used smokeless tobacco. She reports that she drinks alcohol. She reports that she does not use illicit drugs.   Prenatal Transfer Tool  Maternal Diabetes: No Genetic Screening: Late to prenatal care Maternal Ultrasounds/Referrals: Abnormal:  Findings:   Fetal Kidney Anomalies: right pyelectasis Fetal Ultrasounds or other Referrals:  None Maternal Substance Abuse:  No Significant Maternal Medications:  None Significant  Maternal Lab Results:  None Other Comments:  None  ROS: Per HPI  Dilation: 4 Effacement (%): 80 Station: -3 Exam by:: Jeannene Patella RN  Blood pressure 122/80, pulse 79, temperature 97.6 F (36.4 C), temperature source Oral, resp. rate 20, height 5\' 2"  (1.575 m), weight 79.153 kg (174 lb 8 oz), last menstrual period 09/03/2013, unknown if currently breastfeeding. Maternal Exam:  Uterine Assessment: Contraction strength is moderate.  Abdomen: Fetal presentation: vertex     Fetal Exam Fetal Monitor Review: Mode: ultrasound.   Baseline rate: 135.  Variability: moderate (6-25 bpm).   Pattern: accelerations present.    Fetal State Assessment: Category I - tracings are normal.     Physical Exam  Constitutional: She is oriented to person, place, and time. She appears well-developed and well-nourished.  Cardiovascular: Normal rate and regular rhythm.   Respiratory: Effort normal and breath sounds normal.  GI: Soft. There is no tenderness.  Gravid  Musculoskeletal:  Pitting edema in bilateral ankles  Neurological: She is alert and oriented to person, place, and time.    Prenatal labs: ABO, Rh: --/--/O POS (11/18 0012) Antibody: NEG (11/18 0012) Rubella:   RPR: NON REACTIVE (11/18 0012)  HBsAg:    HIV:    GBS:     Assessment/Plan:  Judy Harris is a 28 y.o. [phone] at [redacted]w[redacted]d who presents in latent labor; GBS unknown and no prenatal care.  - admit to birthing suites - CBC, RPR, HIV, UDS, GBS - may have epidural - expectant management, latent labor - desires circumcision outpatient  Judy Harris 06/16/2014, 9:05 PM

## 2014-06-16 NOTE — MAU Note (Signed)
PT  SAYS SHE WAS HERE 5-6 MTHS-.   SAYS SHE  CALLED  DOWNSTAIRS AND  COULD  NOT TAKE NEW PTS.       HAD   U/S   .    DENIES HSV AND MRSA.  LAST SEX-     TODAY.  STARTED HAVING  UC -  THIS AM  BEFORE SEX.

## 2014-06-16 NOTE — Anesthesia Procedure Notes (Signed)
Epidural Patient location during procedure: OB  Preanesthetic Checklist Completed: patient identified, site marked, surgical consent, pre-op evaluation, timeout performed, IV checked, risks and benefits discussed and monitors and equipment checked  Epidural Patient position: sitting Prep: site prepped and draped and DuraPrep Patient monitoring: continuous pulse ox and blood pressure Approach: midline Location: L3-L4 Injection technique: LOR air  Needle:  Needle type: Tuohy  Needle gauge: 17 G Needle length: 9 cm and 9 Needle insertion depth: 7 cm Catheter type: closed end flexible Catheter size: 19 Gauge Catheter at skin depth: 14 cm Test dose: negative  Assessment Events: blood not aspirated, injection not painful, no injection resistance, negative IV test and no paresthesia  Additional Notes Dosing of Epidural:  1st dose, through catheter .............................................  Xylocaine 40 mg  2nd dose, through catheter, after waiting 3 minutes.........Xylocaine 60 mg    ( 1% Xylo charted as a single dose in Epic Meds for ease of charting; actual dosing was fractionated as above, for saftey's sake)  As each dose occurred, patient was free of IV sx; and patient exhibited no evidence of SA injection.  Patient is more comfortable after epidural dosed. Please see RN's note for documentation of vital signs,and FHR which are stable.  Patient reminded not to try to ambulate with numb legs, and that an RN must be present when she attempts to get up.       

## 2014-06-16 NOTE — MAU Note (Signed)
Pt in room 168, will apply monitors.

## 2014-06-17 ENCOUNTER — Encounter (HOSPITAL_COMMUNITY): Payer: Self-pay | Admitting: *Deleted

## 2014-06-17 LAB — OB RESULTS CONSOLE GBS: STREP GROUP B AG: POSITIVE

## 2014-06-17 LAB — HIV ANTIBODY (ROUTINE TESTING W REFLEX): HIV: NONREACTIVE

## 2014-06-17 LAB — RAPID URINE DRUG SCREEN, HOSP PERFORMED
Amphetamines: NOT DETECTED
BENZODIAZEPINES: NOT DETECTED
Barbiturates: NOT DETECTED
Cocaine: NOT DETECTED
OPIATES: NOT DETECTED
Tetrahydrocannabinol: NOT DETECTED

## 2014-06-17 LAB — RPR

## 2014-06-17 MED ORDER — PRENATAL MULTIVITAMIN CH
1.0000 | ORAL_TABLET | Freq: Every day | ORAL | Status: DC
  Administered 2014-06-18 – 2014-06-19 (×2): 1 via ORAL
  Filled 2014-06-17 (×2): qty 1

## 2014-06-17 MED ORDER — SENNOSIDES-DOCUSATE SODIUM 8.6-50 MG PO TABS
2.0000 | ORAL_TABLET | ORAL | Status: DC
  Administered 2014-06-17 – 2014-06-18 (×2): 2 via ORAL
  Filled 2014-06-17 (×2): qty 2

## 2014-06-17 MED ORDER — BENZOCAINE-MENTHOL 20-0.5 % EX AERO
1.0000 "application " | INHALATION_SPRAY | CUTANEOUS | Status: DC | PRN
  Administered 2014-06-17: 1 via TOPICAL

## 2014-06-17 MED ORDER — PENICILLIN G POTASSIUM 5000000 UNITS IJ SOLR
5.0000 10*6.[IU] | Freq: Once | INTRAVENOUS | Status: AC
  Administered 2014-06-17: 5 10*6.[IU] via INTRAVENOUS
  Filled 2014-06-17: qty 5

## 2014-06-17 MED ORDER — LANOLIN HYDROUS EX OINT: TOPICAL_OINTMENT | CUTANEOUS | Status: DC | PRN

## 2014-06-17 MED ORDER — OXYCODONE-ACETAMINOPHEN 5-325 MG PO TABS
2.0000 | ORAL_TABLET | ORAL | Status: DC | PRN
  Administered 2014-06-17 – 2014-06-19 (×4): 2 via ORAL
  Filled 2014-06-17 (×3): qty 2

## 2014-06-17 MED ORDER — OXYCODONE-ACETAMINOPHEN 5-325 MG PO TABS
1.0000 | ORAL_TABLET | ORAL | Status: DC | PRN
  Administered 2014-06-17 – 2014-06-18 (×2): 1 via ORAL
  Filled 2014-06-17 (×4): qty 1

## 2014-06-17 MED ORDER — ONDANSETRON HCL 4 MG/2ML IJ SOLN: 4.0000 mg | INTRAMUSCULAR | Status: DC | PRN

## 2014-06-17 MED ORDER — WITCH HAZEL-GLYCERIN EX PADS: 1.0000 "application " | MEDICATED_PAD | CUTANEOUS | Status: DC | PRN

## 2014-06-17 MED ORDER — ZOLPIDEM TARTRATE 5 MG PO TABS: 5.0000 mg | ORAL_TABLET | Freq: Every evening | ORAL | Status: DC | PRN

## 2014-06-17 MED ORDER — ONDANSETRON HCL 4 MG PO TABS: 4.0000 mg | ORAL_TABLET | ORAL | Status: DC | PRN

## 2014-06-17 MED ORDER — DIPHENHYDRAMINE HCL 25 MG PO CAPS: 25.0000 mg | ORAL_CAPSULE | Freq: Four times a day (QID) | ORAL | Status: DC | PRN

## 2014-06-17 MED ORDER — DIBUCAINE 1 % RE OINT: 1.0000 "application " | TOPICAL_OINTMENT | RECTAL | Status: DC | PRN

## 2014-06-17 MED ORDER — TETANUS-DIPHTH-ACELL PERTUSSIS 5-2.5-18.5 LF-MCG/0.5 IM SUSP: 0.5000 mL | Freq: Once | INTRAMUSCULAR | Status: DC

## 2014-06-17 MED ORDER — SIMETHICONE 80 MG PO CHEW: 80.0000 mg | CHEWABLE_TABLET | ORAL | Status: DC | PRN

## 2014-06-17 MED ORDER — OXYTOCIN 40 UNITS IN LACTATED RINGERS INFUSION - SIMPLE MED
1.0000 m[IU]/min | INTRAVENOUS | Status: DC
  Administered 2014-06-17: 2 m[IU]/min via INTRAVENOUS

## 2014-06-17 MED ORDER — IBUPROFEN 600 MG PO TABS
600.0000 mg | ORAL_TABLET | Freq: Four times a day (QID) | ORAL | Status: DC
  Administered 2014-06-17 – 2014-06-19 (×8): 600 mg via ORAL
  Filled 2014-06-17 (×8): qty 1

## 2014-06-17 MED ORDER — PENICILLIN G POTASSIUM 5000000 UNITS IJ SOLR
2.5000 10*6.[IU] | INTRAVENOUS | Status: DC
  Administered 2014-06-17 (×3): 2.5 10*6.[IU] via INTRAVENOUS
  Filled 2014-06-17 (×7): qty 2.5

## 2014-06-17 NOTE — Plan of Care (Signed)
Problem: Phase II Progression Outcomes Goal: Fetal monitoring per orders Outcome: Completed/Met Date Met:  06/17/14

## 2014-06-17 NOTE — Plan of Care (Signed)
Problem: Phase II Progression Outcomes Goal: Clear liquid diet Outcome: Completed/Met Date Met:  06/17/14

## 2014-06-17 NOTE — Plan of Care (Signed)
Problem: Phase II Progression Outcomes Goal: Frequent position change(s) Outcome: Completed/Met Date Met:  06/17/14

## 2014-06-17 NOTE — Progress Notes (Signed)
Patient ID: Judy Harris, female   DOB: January 09, 1986, 28 y.o.   MRN: [id number] Labor Progress Note  ASSESSMENT:   Judy Harris 28 y.o. [id number] at [redacted]w[redacted]d in active labor   PLAN:  1) Labor curve reviewed.       Progress: Appears to be latent, stalled at 4 cm with contractions spacing out     Plan: AROM when able. Pt currently nauseated and vomiting, therefore will defer to next check  2052: 4/80/-3  0040: 4/80/-2  0400: 4/80/-2  2) Fetal heart tracing reviewed.  Cat I, reassuring  3) GBS Status - Pos, on PCN   4) Other Problems Active Problems:   Active labor   SUBJECTIVE:  Feeling nauseated and vomiting   OBJECTIVE:  Vital Signs: Patient Vitals for the past 2 hrs:  BP Temp Temp src Pulse Resp  06/17/14 0430 128/75 mmHg - - 91 16  06/17/14 0422 - 98.3 F (36.8 C) Oral - -  06/17/14 0400 118/79 mmHg - - 92 16  06/17/14 0331 104/62 mmHg - - (!) 121 18   SVE: Dilation: 4.5, Effacement (%): 50, Station: -2  FHR Monitoring Baseline Rate (A): 145 bpm / mod / + accels / neg decels   Accelerations: 10 x 10 Contraction Frequency (min): 4-6

## 2014-06-17 NOTE — Progress Notes (Signed)
  Subjective: Pt reports comfortable with epidural.  Noticing watery, yellow discharge.   Objective: BP 116/61 mmHg  Pulse 89  Temp(Src) 98.3 F (36.8 C) (Oral)  Resp 20  Ht 5\' 3"  (1.6 m)  Wt 78.926 kg (174 lb)  BMI 30.83 kg/m2  SpO2 99%  LMP 09/03/2013 I/O last 3 completed shifts: In: -  Out: 300 [Urine:300]    FHT:  FHR: 130's bpm, variability: moderate,  accelerations:  Present,  decelerations:  Absent UC:   Irregular,difficult to assess > readjust TOCO SVE:   Dilation: 4.5 Effacement (%): 50, 80 Station: -2 Exam by:: Dr. Teryl Lucy  Labs: Lab Results  Component Value Date   WBC 8.0 06/16/2014   HGB 8.1* 06/16/2014   HCT 27.1* 06/16/2014   MCV 66.7* 06/16/2014   PLT 180 06/16/2014    Assessment / Plan: 28 yo  G5P1213 at [redacted]w[redacted]d wks IUP Early Labor  Labor: BOW not palpable; yellow clear fluid seen on towel, begin pitocin Preeclampsia:  n/a Fetal Wellbeing:  Category I Pain Control:  Epidural I/D:  n/a Anticipated MOD:  NSVD  Kathrine Haddock N 06/17/2014, 9:55 AM

## 2014-06-17 NOTE — Plan of Care (Signed)
Problem: Phase I Progression Outcomes Goal: Assess per MD/Nurse,Routine-VS,FHR,UC,Head to Toe assess Outcome: Completed/Met Date Met:  06/17/14 Goal: Obtain and review prenatal records Outcome: Completed/Met Date Met:  06/17/14 Goal: Pain controlled with appropriate interventions Outcome: Completed/Met Date Met:  06/17/14 Goal: Medications/IV Fluids N/A Outcome: Completed/Met Date Met:  06/17/14

## 2014-06-17 NOTE — Plan of Care (Signed)
Problem: Phase I Progression Outcomes Goal: Tolerating diet Outcome: Completed/Met Date Met:  06/17/14

## 2014-06-17 NOTE — Progress Notes (Signed)
Judy Harris is a 28 y.o. [id number] at [redacted]w[redacted]d by 2nd trimester ultrasound admitted for latent labor  Subjective: No pain. Itchy.   Objective: BP 123/72 mmHg  Pulse 78  Temp(Src) 98.1 F (36.7 C) (Oral)  Resp 18  Ht 5\' 3"  (1.6 m)  Wt 78.926 kg (174 lb)  BMI 30.83 kg/m2  SpO2 100%  LMP 09/03/2013      FHT:  FHR: 135 bpm, variability: moderate,  accelerations:  Present,  decelerations:  Absent UC:   regular, every 4-5 minutes SVE:   Dilation: 4.5 Effacement (%): 80 Station: -2 Exam by:: Dr. Teryl Lucy  Labs: Lab Results  Component Value Date   WBC 8.0 06/16/2014   HGB 8.1* 06/16/2014   HCT 27.1* 06/16/2014   MCV 66.7* 06/16/2014   PLT 180 06/16/2014    Assessment / Plan: Spontaneous labor, progressing normally  Labor: Progressing normally, expectant management Fetal Wellbeing:  Category I Pain Control:  Epidural I/D:  GBS positive. Start penicillin g 39mil units IV, then 2.5 mil units q4hrs. Will aim for at least 4 hours of prophylaxis Anticipated MOD:  NSVD  Cordelia Poche 06/17/2014, 12:02 AM

## 2014-06-17 NOTE — Progress Notes (Signed)
Delivery Note At a viable female was delivered via SVD with vacuum assist by Dr. Roselie Awkward  (Presentation:R OA ;  ).  APGAR: 9, 9; weight pending  Placenta status: intact, .  Cord:  3 vessel with the following complications: none .  Cord pH: n/a  Anesthesia:  Epidural Episiotomy:   none Lacerations:   none Est. Blood Loss (mL):   100  Mom to postpartum.  Baby to Couplet care / Skin to Skin.  Judy Harris 06/17/2014, 1:52 PM   I was present for delivery and agree with note above. Venia Carbon Michiel Cowboy, CNM

## 2014-06-17 NOTE — Progress Notes (Signed)
   Subjective: Pt reports still comfortable with epidural.  No questions or concerns.    Objective: BP 121/73 mmHg  Pulse 74  Temp(Src) 98.3 F (36.8 C) (Oral)  Resp 18  Ht 5\' 3"  (1.6 m)  Wt 78.926 kg (174 lb)  BMI 30.83 kg/m2  SpO2 99%  LMP 09/03/2013 I/O last 3 completed shifts: In: -  Out: 300 [Urine:300]    FHT:  FHR: 150's bpm, variability: moderate,  accelerations:  Present,  decelerations:  Present intermittent variable decels that retur to baseline.  UC:   regular, every 2-3 minutes SVE:   Dilation: 5.5 Effacement (%): 50 Station: -2 Exam by:: Judy Harris,CNM  Labs: Lab Results  Component Value Date   WBC 8.0 06/16/2014   HGB 8.1* 06/16/2014   HCT 27.1* 06/16/2014   MCV 66.7* 06/16/2014   PLT 180 06/16/2014    Assessment / Plan: 28 yo G5P1213 at [redacted]w[redacted]d wks IUP GBS pos Augmentation of Labor  Labor: Progressing normally Preeclampsia:  n/a Fetal Wellbeing:  Category II Pain Control:  Epidural I/D:  GBS pos Anticipated MOD:  NSVD  Judy Harris N 06/17/2014, 11:34 AM

## 2014-06-18 DIAGNOSIS — D649 Anemia, unspecified: Secondary | ICD-10-CM

## 2014-06-18 DIAGNOSIS — O480 Post-term pregnancy: Secondary | ICD-10-CM

## 2014-06-18 NOTE — Progress Notes (Signed)
Clinical Social Work Department PSYCHOSOCIAL ASSESSMENT - MATERNAL/CHILD 06/18/2014  Patient:  Judy Harris,Judy Harris  Account Number:  [id number]  Admit Date:  06/16/2014  Childs Name:   Judy Harris   Clinical Social Worker:  Jaymarion Trombly, CLINICAL SOCIAL WORKER   Date/Time:  06/18/2014 09:45 AM  Date Referred:  06/17/2014   Referral source  Central Nurs[facility]     Referred reason  LPNC   Other referral source:    I:  FAMILY / HOME ENVIRONMENT Child's legal guardian:  PARENT  Guardian - Name Guardian - Age Guardian - Address  Judy Harris 28 [address] [place], [place] [address]  Mr. Harris  same as above   Other household support members/support persons Name Relationship DOB   DAUGHTER 06/2013   SON 04/2012   SON 12 years old   Other support:   MOB reported belief that their family members and friends are supporitve.    II  PSYCHOSOCIAL DATA Information Source:  Family Interview  Financial and Community Resources Employment:   MOB stated that she stays at home with her children. FOB stated that he is employed.   Financial resources:  Medicaid If Medicaid - County:  [place] Other  Food Stamps  WIC   School / Grade:  N/A Maternity Care Coordinator / Child Services Coordination / Early Interventions:   None reported  Cultural issues impacting care:   None reported    III  STRENGTHS Strengths  Adequate Resources  Home prepared for Child (including basic supplies)  Supportive family/friends   Strength comment:    IV  RISK FACTORS AND CURRENT PROBLEMS Current Problem:  YES   Risk Factor & Current Problem Patient Issue Family Issue Risk Factor / Current Problem Comment  Other - See comment Y N MOB did not receive any prenatal care during her pregnancy.  She had one visit in the [facility] and never established regular/routine care. Baby and MOB's UDS are negative.    V  SOCIAL WORK ASSESSMENT CSW met with the MOB in her room in order to complete the assessment.  Consult was ordered due to MOB not receiving any prenatal care during her pregnancy.  MOB provided consent for the FOB to be present for the visit.  Also present were MOB's oldest child and one year old child.  Per the MOB, her other child is currently being cared for by the child's godmother.  CSW noted that the MOB's other children presented as well nourished and were well groomed.  MOB presented in a pleasant mood, displayed a full range in affect.  She was easily engaged, but presents as a limited historian as evidenced by her report secondary to prenatal care being same story that she has told the CSW in 2013 and 2014 when she also received consult for no prenatal care.    MOB expressed excitement upon the birth of the baby, but also stated that she is overwhelmed about the thought of having three children under the age of 3.  She shared belief that she will "get by" since she has positive supports and she knows that she will adjust.   MOB confirmed that the home is prepared for the baby.  MOB denied presence of additional psychosocial stressors that may negatively impact her ability to care for her other children.   MOB acknowledged no prenatal care during pregnancy.  She stated that she and the FOB had a difficult time deciding if they wanted the pregnancy or if they wanted to have an abortion.    She shared that it "eventually got too far along", and they were unable to have an abortion.  MOB stated that she is happy with their decision, and became excited once they heard the heart beat and learned that they were having a little boy.  MOB expressed that since she received the one ultrasound, they have been busy preparing for the baby.  Per MOB, she was "unable to be accepted anywhere" since she was "too far along".  CSW shared with MOB the observation that her report secondary to no prenatal care is consistent with all her other reports when she has met with the CSW in previous years.  MOB did not respond  to the CSW observation, but stated that it is a pattern for her to not always know if she wants to have an abortion or keep the baby.    CSW provided education on hospital drug screen policy.  MOB and FOB denied questions or concerns about the policy.  MOB denied substance use history and expressed confidence that the MDS would be negative.    MOB denied mental health history, but was receptive to education on postpartum depression.   No barriers to discharge.    VI SOCIAL WORK PLAN Social Work Plan  Patient/Family Education  No Further Intervention Required / No Barriers to Discharge   Type of pt/family education:   Postpartum depression  Hospital drug screen policy   If child protective services report - county:   If child protective services report - date:   Information/referral to community resources comment:   MOB declined need for referrals   Other social work plan:   CSW to follow up PRN.  CSW to monitor MDS and will make CPS report if needed.     

## 2014-06-18 NOTE — Plan of Care (Signed)
Problem: Consults Goal: Skin Care Protocol Initiated - if Braden Score 18 or less If consults are not indicated, leave blank or document N/A  Outcome: Not Applicable Date Met:  18/56/31 Goal: Nutrition Consult-if indicated Outcome: Not Applicable Date Met:  49/70/26 Goal: Diabetes Guidelines if Diabetic/Glucose > 140 If diabetic or lab glucose is > 140 mg/dl - Initiate Diabetes/Hyperglycemia Guidelines & Document Interventions  Outcome: Not Applicable Date Met:  37/85/88  Problem: Phase I Progression Outcomes Goal: Pain controlled with appropriate interventions Outcome: Completed/Met Date Met:  06/18/14 Goal: Voiding adequately Outcome: Completed/Met Date Met:  06/18/14 Goal: Foley catheter patent Outcome: Not Applicable Date Met:  50/27/74 Goal: OOB as tolerated unless otherwise ordered Outcome: Completed/Met Date Met:  06/18/14 Goal: IS, TCDB as ordered Outcome: Not Applicable Date Met:  12/87/86 Goal: VS, stable, temp < 100.4 degrees F Outcome: Completed/Met Date Met:  06/18/14 Goal: Initial discharge plan identified Outcome: Completed/Met Date Met:  06/18/14 Goal: Other Phase I Outcomes/Goals Outcome: Completed/Met Date Met:  06/18/14  Problem: Phase II Progression Outcomes Goal: Pain controlled on oral analgesia Outcome: Completed/Met Date Met:  06/18/14 Goal: Progress activity as tolerated unless otherwise ordered Outcome: Completed/Met Date Met:  06/18/14 Goal: Afebrile, VS remain stable Outcome: Completed/Met Date Met:  06/18/14 Goal: Incision intact & without signs/symptoms of infection Outcome: Not Applicable Date Met:  76/72/09 Goal: Rh isoimmunization per orders Outcome: Completed/Met Date Met:  06/18/14 Goal: Tolerating diet Outcome: Completed/Met Date Met:  06/18/14 Goal: Other Phase II Outcomes/Goals Outcome: Completed/Met Date Met:  06/18/14  Problem: Discharge Progression Outcomes Goal: MMR given as ordered Outcome: Not Applicable Date Met:  47/09/62

## 2014-06-18 NOTE — Progress Notes (Signed)
Post Partum Day #1 Subjective: up ad lib and tolerating PO; some vaginal pain  Objective: Blood pressure 129/60, pulse 77, temperature 98.1 F (36.7 C), temperature source Oral, resp. rate 18, height 5\' 3"  (1.6 m), weight 78.926 kg (174 lb), last menstrual period 09/03/2013, SpO2 100 %, unknown if currently breastfeeding.  Physical Exam:  General: alert and cooperative Lochia: appropriate Uterine Fundus: firm Incision: n/a DVT Evaluation: No evidence of DVT seen on physical exam. Negative Homan's sign.   Recent Labs  06/16/14 2135  HGB 8.1*  HCT 27.1*    Assessment/Plan: Plan for discharge tomorrow; Outpatient circumcision; bottle; Depo-provera for contraception   LOS: 2 days   Cordelia Poche 06/18/2014, 6:41 AM   OB fellow attestation Post Partum Day 1 I have seen and examined this patient and agree with above documentation in the resident's note.   Judy Harris is a 28 y.o. [id number] s/p SVD.  Pt denies problems with ambulating, voiding or po intake. Pain is well controlled.  Method of Feeding: br and bottle.  Complaining of back pain however pain medications working well.  PE:  BP 129/60 mmHg  Pulse 77  Temp(Src) 98.1 F (36.7 C) (Oral)  Resp 18  Ht 5\' 3"  (1.6 m)  Wt 174 lb (78.926 kg)  BMI 30.83 kg/m2  SpO2 100%  LMP 09/03/2013  Breastfeeding? Unknown Fundus firm  Plan for discharge: tomorrow   Merla Riches, MD 9:27 AM

## 2014-06-18 NOTE — Progress Notes (Signed)
UR chart review completed.  

## 2014-06-18 NOTE — Anesthesia Postprocedure Evaluation (Signed)
Anesthesia Post Note  Patient: Judy Harris  Procedure(s) Performed: * No procedures listed *  Anesthesia type: Epidural  Patient location: Mother/Baby  Post pain: Pain level controlled  Post assessment: Post-op Vital signs reviewed  Last Vitals:  Filed Vitals:   06/18/14 0500  BP: 129/60  Pulse: 77  Temp: 36.7 C  Resp: 18    Post vital signs: Reviewed  Level of consciousness:alert  Complications: No apparent anesthesia complications

## 2014-06-19 MED ORDER — IBUPROFEN 600 MG PO TABS: 600.0000 mg | ORAL_TABLET | Freq: Four times a day (QID) | ORAL | Status: DC | PRN

## 2014-06-19 NOTE — Plan of Care (Signed)
Problem: Discharge Progression Outcomes Goal: Barriers To Progression Addressed/Resolved Outcome: Completed/Met Date Met:  06/19/14 Goal: Activity appropriate for discharge plan Outcome: Completed/Met Date Met:  40/98/11 Goal: Complications resolved/controlled Outcome: Completed/Met Date Met:  06/19/14 Goal: Pain controlled with appropriate interventions Outcome: Completed/Met Date Met:  06/19/14 Goal: Afebrile, VS remain stable at discharge Outcome: Completed/Met Date Met:  06/19/14 Goal: Remove staples per MD order Outcome: Completed/Met Date Met:  06/19/14 Goal: Discharge plan in place and appropriate Outcome: Completed/Met Date Met:  06/19/14 Goal: Other Discharge Outcomes/Goals Outcome: Completed/Met Date Met:  06/19/14

## 2014-06-19 NOTE — Discharge Instructions (Signed)

## 2014-06-19 NOTE — Discharge Summary (Signed)
Obstetric Discharge Summary Reason for Admission: onset of labor Prenatal Procedures: none Intrapartum Procedures: spontaneous vaginal delivery and vacuum Postpartum Procedures: none Complications-Operative and Postpartum: none HEMOGLOBIN  Date Value Ref Range Status  06/16/2014 8.1* 12.0 - 15.0 g/dL Final   HCT  Date Value Ref Range Status  06/16/2014 27.1* 36.0 - 46.0 % Final    Physical Exam:  General: alert and cooperative Lochia: appropriate Uterine Fundus: firm Incision: n/a DVT Evaluation: No evidence of DVT seen on physical exam.  Discharge Diagnoses: Term Pregnancy-delivered  Discharge Information: Date: 06/19/2014 Activity: pelvic rest Diet: routine Medications: PNV, Ibuprofen and Iron Condition: stable Instructions: refer to practice specific booklet Discharge to: home   Newborn Data: Live born female  Birth Weight: 7 lb 4.9 oz (3314 g) APGAR: 9, 9  Home with mother.  Cordelia Poche 06/19/2014, 7:51 AM

## 2014-06-19 NOTE — Plan of Care (Signed)
Problem: Consults Goal: Postpartum Patient Education (See Patient Education module for education specifics.)  Outcome: Completed/Met Date Met:  06/19/14  Problem: Discharge Progression Outcomes Goal: Tolerating diet Outcome: Completed/Met Date Met:  06/19/14

## 2014-06-19 NOTE — Progress Notes (Signed)
CSW followed up with the MOB due to RN reporting that conflict surrounding paternity occurred on 11/18 which resulted in security being called.  RN reported that the FOB left, but later returned with the MOB's consent.   MOB and FOB were in room together when CSW arrived.  MOB and FOB stated that they were "fine" and presented as guarded about the situation.  The MOB stated that it was related to "family drama", but did not further process or identify what occurred.  MOB denied any concerns about the incident, and stated that she is eager to return home.    No barriers to discharge.

## 2014-06-24 ENCOUNTER — Telehealth: Payer: Self-pay

## 2014-06-24 NOTE — Telephone Encounter (Signed)
Patient called stating she delivered on 06/17/14 and is experiencing "excrutiating pain in pelvic area around to back" where she had her epidural and is "cramping to the max." Reports its uncomfortable to walk and scoot up in bed. Is requesting stronger pain medication. Discussed with Dr. Roselie Awkward who believes pain is likely post labor pains but advised that should patient feel it is more concerning she can come to [facility] for evaluation-- no stronger pain medication to be prescribed. Informed patient of this-- verbalized understanding. No further questions or concerns.

## 2014-06-25 NOTE — Progress Notes (Signed)
This is in reference to the Discharge Summary by Dr Lonny Prude.  I have seen and examined this patient and I agree with the above. Serita Grammes CNM 12:55 AM 06/25/2014

## 2014-08-04 ENCOUNTER — Ambulatory Visit: Payer: Medicaid Other | Admitting: Family Medicine

## 2014-08-04 ENCOUNTER — Telehealth: Payer: Self-pay

## 2014-08-04 NOTE — Telephone Encounter (Signed)
Patient missed PP visit with Dr. Nehemiah Settle. Attempted to call. No answer and no voicemail set up. Will send letter.

## 2015-02-14 NOTE — Telephone Encounter (Signed)
comleted

## 2015-06-14 ENCOUNTER — Inpatient Hospital Stay (HOSPITAL_COMMUNITY)
Admission: AD | Admit: 2015-06-14 | Discharge: 2015-06-14 | Disposition: A | Discharge status: Home / Self Care | Payer: Medicaid Other | Source: Ambulatory Visit | Attending: Obstetrics & Gynecology | Admitting: Obstetrics & Gynecology

## 2015-06-14 ENCOUNTER — Encounter (HOSPITAL_COMMUNITY): Payer: Self-pay | Admitting: *Deleted

## 2015-06-14 DIAGNOSIS — Z3201 Encounter for pregnancy test, result positive: Secondary | ICD-10-CM | POA: Insufficient documentation

## 2015-06-14 DIAGNOSIS — Z3200 Encounter for pregnancy test, result unknown: Secondary | ICD-10-CM | POA: Diagnosis present

## 2015-06-14 LAB — POCT PREGNANCY, URINE: Preg Test, Ur: POSITIVE — AB

## 2015-06-14 NOTE — Discharge Instructions (Signed)
First Trimester of Pregnancy  The first trimester of pregnancy is from week 1 until the end of week 12 (months 1 through 3). A week after a sperm fertilizes an egg, the egg will implant on the wall of the uterus. This embryo will begin to develop into a baby. Genes from you and your partner are forming the baby. The female genes determine whether the baby is a boy or a girl. At 6-8 weeks, the eyes and face are formed, and the heartbeat can be seen on ultrasound. At the end of 12 weeks, all the baby's organs are formed.   Now that you are pregnant, you will want to do everything you can to have a healthy baby. Two of the most important things are to get good prenatal care and to follow your health care provider's instructions. Prenatal care is all the medical care you receive before the baby's birth. This care will help prevent, find, and treat any problems during the pregnancy and childbirth.  BODY CHANGES  Your body goes through many changes during pregnancy. The changes vary from woman to woman.   · You may gain or lose a couple of pounds at first.  · You may feel sick to your stomach (nauseous) and throw up (vomit). If the vomiting is uncontrollable, call your health care provider.  · You may tire easily.  · You may develop headaches that can be relieved by medicines approved by your health care provider.  · You may urinate more often. Painful urination may mean you have a bladder infection.  · You may develop heartburn as a result of your pregnancy.  · You may develop constipation because certain hormones are causing the muscles that push waste through your intestines to slow down.  · You may develop hemorrhoids or swollen, bulging veins (varicose veins).  · Your breasts may begin to grow larger and become tender. Your nipples may stick out more, and the tissue that surrounds them (areola) may become darker.  · Your gums may bleed and may be sensitive to brushing and flossing.   · Dark spots or blotches (chloasma, mask of pregnancy) may develop on your face. This will likely fade after the baby is born.  · Your menstrual periods will stop.  · You may have a loss of appetite.  · You may develop cravings for certain kinds of food.  · You may have changes in your emotions from day to day, such as being excited to be pregnant or being concerned that something may go wrong with the pregnancy and baby.  · You may have more vivid and strange dreams.  · You may have changes in your hair. These can include thickening of your hair, rapid growth, and changes in texture. Some women also have hair loss during or after pregnancy, or hair that feels dry or thin. Your hair will most likely return to normal after your baby is born.  WHAT TO EXPECT AT YOUR PRENATAL VISITS  During a routine prenatal visit:  · You will be weighed to make sure you and the baby are growing normally.  · Your blood pressure will be taken.  · Your abdomen will be measured to track your baby's growth.  · The fetal heartbeat will be listened to starting around week 10 or 12 of your pregnancy.  · Test results from any previous visits will be discussed.  Your health care provider may ask you:  · How you are feeling.  · If you   including cigarettes, chewing tobacco, and electronic cigarettes. °· If you have any questions. °Other tests that may be performed during your first trimester include: °· Blood tests to find your blood type and to check for the presence of any previous infections. They will also be used to check for low iron levels (anemia) and Rh antibodies. Later in the pregnancy, blood tests for diabetes will be done along with other tests if problems develop. °· Urine tests to check for infections, diabetes, or protein in the urine. °· An ultrasound to confirm the proper growth  and development of the baby. °· An amniocentesis to check for possible genetic problems. °· Fetal screens for spina bifida and Down syndrome. °· You may need other tests to make sure you and the baby are doing well. °· HIV (human immunodeficiency virus) testing. Routine prenatal testing includes screening for HIV, unless you choose not to have this test. °HOME CARE INSTRUCTIONS  °Medicines °· Follow your health care provider's instructions regarding medicine use. Specific medicines may be either safe or unsafe to take during pregnancy. °· Take your prenatal vitamins as directed. °· If you develop constipation, try taking a stool softener if your health care provider approves. °Diet °· Eat regular, well-balanced meals. Choose a variety of foods, such as meat or vegetable-based protein, fish, milk and low-fat dairy products, vegetables, fruits, and whole grain breads and cereals. Your health care provider will help you determine the amount of weight gain that is right for you. °· Avoid raw meat and uncooked cheese. These carry germs that can cause birth defects in the baby. °· Eating four or five small meals rather than three large meals a day may help relieve nausea and vomiting. If you start to feel nauseous, eating a few soda crackers can be helpful. Drinking liquids between meals instead of during meals also seems to help nausea and vomiting. °· If you develop constipation, eat more high-fiber foods, such as fresh vegetables or fruit and whole grains. Drink enough fluids to keep your urine clear or pale yellow. °Activity and Exercise °· Exercise only as directed by your health care provider. Exercising will help you: °¨ Control your weight. °¨ Stay in shape. °¨ Be prepared for labor and delivery. °· Experiencing pain or cramping in the lower abdomen or low back is a good sign that you should stop exercising. Check with your health care provider before continuing normal exercises. °· Try to avoid standing for long  periods of time. Move your legs often if you must stand in one place for a long time. °· Avoid heavy lifting. °· Wear low-heeled shoes, and practice good posture. °· You may continue to have sex unless your health care provider directs you otherwise. °Relief of Pain or Discomfort °· Wear a good support bra for breast tenderness.   °· Take warm sitz baths to soothe any pain or discomfort caused by hemorrhoids. Use hemorrhoid cream if your health care provider approves.   °· Rest with your legs elevated if you have leg cramps or low back pain. °· If you develop varicose veins in your legs, wear support hose. Elevate your feet for 15 minutes, 3-4 times a day. Limit salt in your diet. °Prenatal Care °· Schedule your prenatal visits by the twelfth week of pregnancy. They are usually scheduled monthly at first, then more often in the last 2 months before delivery. °· Write down your questions. Take them to your prenatal visits. °· Keep all your prenatal visits as directed by your   health care provider. Safety  Wear your seat belt at all times when driving.  Make a list of emergency phone numbers, including numbers for family, friends, the hospital, and police and fire departments. General Tips  Ask your health care provider for a referral to a local prenatal education class. Begin classes no later than at the beginning of month 6 of your pregnancy.  Ask for help if you have counseling or nutritional needs during pregnancy. Your health care provider can offer advice or refer you to specialists for help with various needs.  Do not use hot tubs, steam rooms, or saunas.  Do not douche or use tampons or scented sanitary pads.  Do not cross your legs for long periods of time.  Avoid cat litter boxes and soil used by cats. These carry germs that can cause birth defects in the baby and possibly loss of the fetus by miscarriage or stillbirth.  Avoid all smoking, herbs, alcohol, and medicines not prescribed by  your health care provider. Chemicals in these affect the formation and growth of the baby.  Do not use any tobacco products, including cigarettes, chewing tobacco, and electronic cigarettes. If you need help quitting, ask your health care provider. You may receive counseling support and other resources to help you quit.  Schedule a dentist appointment. At home, brush your teeth with a soft toothbrush and be gentle when you floss. SEEK MEDICAL CARE IF:   You have dizziness.  You have mild pelvic cramps, pelvic pressure, or nagging pain in the abdominal area.  You have persistent nausea, vomiting, or diarrhea.  You have a bad smelling vaginal discharge.  You have pain with urination.  You notice increased swelling in your face, hands, legs, or ankles. SEEK IMMEDIATE MEDICAL CARE IF:   You have a fever.  You are leaking fluid from your vagina.  You have spotting or bleeding from your vagina.  You have severe abdominal cramping or pain.  You have rapid weight gain or loss.  You vomit blood or material that looks like coffee grounds.  You are exposed to [place] measles and have never had them.  You are exposed to fifth disease or chickenpox.  You develop a severe headache.  You have shortness of breath.  You have any kind of trauma, such as from a fall or a car accident.   This information is not intended to replace advice given to you by your health care provider. Make sure you discuss any questions you have with your health care provider.   Document Released: 07/12/2001 Document Revised: 08/08/2014 Document Reviewed: 05/28/2013 Elsevier Interactive Patient Education 2016 [employer].    ________________________________________     To schedule your Maternity Eligibility Appointment, please call [phone].  When you arrive for your appointment you must bring the following items or information listed below.  Your appointment will be rescheduled if you do not have  these items or are 15 minutes late. If currently receiving Medicaid, you MUST bring: 1. Medicaid Card 2. Social Security Card 3. Picture ID 4. Proof of Pregnancy 5. Verification of current address if the address on Medicaid card is incorrect "postmarked mail" If not receiving Medicaid, you MUST bring: 1. Social Security Card 2. Picture ID 3. Birth Certificate (if available) Passport or *Green Card 4. Proof of Pregnancy 5. Verification of current address "postmarked mail" for each income presented. 6. Verification of insurance coverage, if any 7. Check stubs from each employer for the previous month (if unable to present  check stub  for each week, we will accept check stub for the first and last week ill the same month.) If you can't locate check stubs, you must bring a letter from the employer(s) and it must have the following information on letterhead, typed, in English: o name of [place] telephone number o how long been with the company, if less than one month o how much person earns per hour o how many hours per week work o the gross pay the person earned for the previous month If you are 31 years old or less, you do not have to bring proof of income unless you work or live with the father of the baby and at that time we will need proof of income from you and/or the father of the baby. Green Card recipients are eligible for Medicaid for Pregnant Women (MPW)  Prenatal Care Providers [facility]  [phone]     Phone: [place]                      [facility]  @[place]     Phone: [phone]  Phone: [place]     Phone: [phone]  Phone: [place] [facility] [place]                hone: [phone]  Phone: [phone]         [facility] [address]      Phone: [phone]  Phone: [phone]         [place] Dept.                Phone: [phone]  [place])          Phone: [phone] [facility]   Phone: [phone]

## 2015-06-14 NOTE — MAU Provider Note (Signed)
CC: Possible Pregnancy    First Provider Initiated Contact with Patient 06/14/15 1743      Subjective HPI Judy Harris 29 y.o. [id number] [redacted]w[redacted]d by sure LMP presents for verification of pregnancy. HPT positive. Denies vaginal bleeding, abdominal pain and nausea/vomiting. No other concerns.   Significant PMH: None.  Significant OB history: None Nonsmoker  Objective Blood pressure 129/78, pulse 98, temperature 98.4 F (36.9 C), temperature source Oral, resp. rate 16, height 5\' 2"  (1.575 m), weight 158 lb 12.8 oz (72.031 kg), last menstrual period 04/06/2015, unknown if currently breastfeeding.  Physical Exam General: WN, WD female in no apparent distress Abdomen: soft, nontender, without organomegaly  Lab Results Results for orders placed or performed during the hospital encounter of 06/14/15 (from the past 24 hour(s))  Pregnancy, urine POC     Status: Abnormal   Collection Time: 06/14/15  5:31 PM  Result Value Ref Range   Preg Test, Ur POSITIVE (A) NEGATIVE    Assessment 29 y.o. [id number] [redacted]w[redacted]d  Plan AVS on pregnancy precautions Pregnancy verification letter and eligibility information given List of prenatal care providers given   Medication List    STOP taking these medications        ibuprofen 600 MG tablet  Commonly known as:  ADVIL,MOTRIN      TAKE these medications        prenatal multivitamin Tabs tablet  Take 1 tablet by mouth daily at 12 noon.           Follow-up Information    Follow up with [facility]. Schedule an appointment as soon as possible for a visit in 1 week.   Contact information:   [place] [id number] [place], CNM 06/14/2015 5:43 PM

## 2015-06-14 NOTE — MAU Note (Signed)
Wanting preg confirmation.  +HPT 3 wks ago.  "in shock'

## 2015-06-19 ENCOUNTER — Inpatient Hospital Stay (HOSPITAL_COMMUNITY)
Admission: AD | Admit: 2015-06-19 | Discharge: 2015-06-19 | Disposition: A | Discharge status: Home / Self Care | Payer: Medicaid Other | Source: Ambulatory Visit | Attending: Obstetrics and Gynecology | Admitting: Obstetrics and Gynecology

## 2015-06-19 ENCOUNTER — Encounter (HOSPITAL_COMMUNITY): Payer: Self-pay

## 2015-06-19 ENCOUNTER — Inpatient Hospital Stay (HOSPITAL_COMMUNITY): Payer: Medicaid Other

## 2015-06-19 DIAGNOSIS — O9989 Other specified diseases and conditions complicating pregnancy, childbirth and the puerperium: Secondary | ICD-10-CM | POA: Diagnosis not present

## 2015-06-19 DIAGNOSIS — O26899 Other specified pregnancy related conditions, unspecified trimester: Secondary | ICD-10-CM

## 2015-06-19 DIAGNOSIS — O26892 Other specified pregnancy related conditions, second trimester: Secondary | ICD-10-CM | POA: Insufficient documentation

## 2015-06-19 DIAGNOSIS — Z3A20 20 weeks gestation of pregnancy: Secondary | ICD-10-CM | POA: Diagnosis not present

## 2015-06-19 DIAGNOSIS — O468X2 Other antepartum hemorrhage, second trimester: Secondary | ICD-10-CM

## 2015-06-19 DIAGNOSIS — R109 Unspecified abdominal pain: Secondary | ICD-10-CM

## 2015-06-19 DIAGNOSIS — O3412 Maternal care for benign tumor of corpus uteri, second trimester: Secondary | ICD-10-CM | POA: Insufficient documentation

## 2015-06-19 DIAGNOSIS — R102 Pelvic and perineal pain: Secondary | ICD-10-CM | POA: Diagnosis present

## 2015-06-19 DIAGNOSIS — O418X20 Other specified disorders of amniotic fluid and membranes, second trimester, not applicable or unspecified: Secondary | ICD-10-CM

## 2015-06-19 DIAGNOSIS — O3410 Maternal care for benign tumor of corpus uteri, unspecified trimester: Secondary | ICD-10-CM

## 2015-06-19 DIAGNOSIS — D259 Leiomyoma of uterus, unspecified: Secondary | ICD-10-CM

## 2015-06-19 LAB — URINALYSIS, ROUTINE W REFLEX MICROSCOPIC
Bilirubin Urine: NEGATIVE
Glucose, UA: NEGATIVE mg/dL
Hgb urine dipstick: NEGATIVE
KETONES UR: NEGATIVE mg/dL
LEUKOCYTES UA: NEGATIVE
NITRITE: NEGATIVE
Protein, ur: NEGATIVE mg/dL
Specific Gravity, Urine: 1.015 (ref 1.005–1.030)
pH: 7 (ref 5.0–8.0)

## 2015-06-19 NOTE — Progress Notes (Signed)
Patient states that she is unsure of when her actual period was, has history of irregular periods.

## 2015-06-19 NOTE — MAU Provider Note (Signed)
History     CSN: [id number]  Arrival date and time: 06/19/15 1122    First Provider Initiated Contact with Patient 06/19/15 1211        Chief Complaint  Patient presents with  . Abdominal Pain   HPI Judy Harris is a 29 y.o. [id number] who presents for abdominal pain in pregnancy. Per LMP patient thought she was 10-[redacted] weeks pregnant. Plans on getting care with [facility], but hasn't seen them in over 2 years.  Right sided pain started a few days ago. Comes & goes; worse with movement & position changes. Denies abdominal pain, vaginal bleeding, or LOF.  Reports fetal movement.  Denies n/v/d/constipation.   OB History    Gravida Para Term Preterm AB TAB SAB Ectopic Multiple Living   6 4 2 2 1   1  0 4      Past Medical History  Diagnosis Date  . Anemia     Has taken Iron supp;is currently taking now  . Infection     Yeast inf;not freq  . 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 Infection     UTI;no freq  . SOB (shortness of breath) 2013    Since the preg;SOB @ times    Past Surgical History  Procedure Laterality Date  . Ectopic pregnancy surgery      Family History  Problem Relation Age of Onset  . Diabetes Maternal Grandmother   . Hypertension Maternal Grandmother   . Stroke Maternal Grandmother   . Hypertension Paternal Grandmother   . Diabetes Paternal Grandmother   . Heart disease Paternal Grandmother   . Cancer Paternal Aunt     Breast    Social History  Substance Use Topics  . Smoking status: Never Smoker   . Smokeless tobacco: Never Used  . Alcohol Use: Yes     Comment: Wine occasionally when not pregnant    Allergies: No Known Allergies  No prescriptions prior to admission    Review of Systems  Constitutional: Negative.   Gastrointestinal: Positive for abdominal pain (right side pain). Negative for nausea, vomiting, diarrhea and constipation.  Genitourinary: Negative.    Physical Exam   Blood pressure 130/68, pulse 109, temperature 98 F (36.7  C), temperature source Oral, resp. rate 18, height 5\' 2"  (1.575 m), last menstrual period 04/06/2015, unknown if currently breastfeeding.  Physical Exam  Nursing note and vitals reviewed. Constitutional: She is oriented to person, place, and time. She appears well-developed and well-nourished. No distress.  HENT:  Head: Normocephalic and atraumatic.  Eyes: Conjunctivae are normal. Right eye exhibits no discharge. Left eye exhibits no discharge. No scleral icterus.  Neck: Normal range of motion.  Cardiovascular: Normal rate, regular rhythm and normal heart sounds.   No murmur heard. Respiratory: Effort normal and breath sounds normal. No respiratory distress. She has no wheezes.  GI: Soft. Bowel sounds are normal. She exhibits distension (fundal height 20 cm). There is no tenderness. There is no rebound and no guarding.  Neurological: She is alert and oriented to person, place, and time.  Skin: Skin is warm and dry. She is not diaphoretic.  Psychiatric: She has a normal mood and affect. Her behavior is normal. Judgment and thought content normal.    MAU Course  Procedures    MDM Pt refused blood work & cervical exam   Assessment and Plan  A: 1. Pain of round ligament affecting pregnancy, antepartum   2. Abdominal pain in pregnancy   3. Uterine fibroid  during pregnancy, antepartum   4. Subchorionic hematoma in second trimester    P: Discharge home Preterm labor & bleeding precautions Pelvic rest Call [facility] asap to start prenatal care Discussed slow position changes & maternity support belt for round ligament pain  Jorje Guild, NP  06/19/2015, 12:11 PM

## 2015-06-19 NOTE — Discharge Instructions (Signed)
Subchorionic Hematoma A subchorionic hematoma is a gathering of blood between the outer wall of the placenta and the inner wall of the womb (uterus). The placenta is the organ that connects the fetus to the wall of the uterus. The placenta performs the feeding, breathing (oxygen to the fetus), and waste removal (excretory work) of the fetus.  Subchorionic hematoma is the most common abnormality found on a result from ultrasonography done during the first trimester or early second trimester of pregnancy. If there has been little or no vaginal bleeding, early small hematomas usually shrink on their own and do not affect your baby or pregnancy. The blood is gradually absorbed over 1-2 weeks. When bleeding starts later in pregnancy or the hematoma is larger or occurs in an older pregnant woman, the outcome may not be as good. Larger hematomas may get bigger, which increases the chances for miscarriage. Subchorionic hematoma also increases the risk of premature detachment of the placenta from the uterus, preterm (premature) labor, and stillbirth. HOME CARE INSTRUCTIONS  Stay on bed rest if your health care provider recommends this. Although bed rest will not prevent more bleeding or prevent a miscarriage, your health care provider may recommend bed rest until you are advised otherwise.  Avoid heavy lifting (more than 10 lb [4.5 kg]), exercise, sexual intercourse, or douching as directed by your health care provider.  Keep track of the number of pads you use each day and how soaked (saturated) they are. Write down this information.  Do not use tampons.  Keep all follow-up appointments as directed by your health care provider. Your health care provider may ask you to have follow-up blood tests or ultrasound tests or both. SEEK IMMEDIATE MEDICAL CARE IF:  You have severe cramps in your stomach, back, abdomen, or pelvis.  You have a fever.  You pass large clots or tissue. Save any tissue for your health  care provider to look at.  Your bleeding increases or you become lightheaded, feel weak, or have fainting episodes.   This information is not intended to replace advice given to you by your health care provider. Make sure you discuss any questions you have with your health care provider.   Document Released: 11/02/2006 Document Revised: 08/08/2014 Document Reviewed: 02/14/2013 Elsevier Interactive Patient Education 2016 Elsevier Inc. Uterine Fibroids Uterine fibroids are tissue masses (tumors). They are also called leiomyomas. They can develop inside of a woman's womb (uterus). They can grow very large. Fibroids are not cancerous (benign). Most fibroids do not require medical treatment. HOME CARE  Keep all follow-up visits as told by your doctor. This is important.  Take medicines only as told by your doctor.  If you were prescribed a hormone treatment, take the hormone medicines exactly as told.  Do not take aspirin. It can cause bleeding.  Ask your doctor about taking iron pills and increasing the amount of dark green, leafy vegetables in your diet. These actions can help to boost your blood iron levels.  Pay close attention to your period. Tell your doctor about any changes, such as:  Increased blood flow. This may require you to use more pads or tampons than usual per month.  A change in the number of days that your period lasts per month.  A change in symptoms that come with your period, such as back pain or cramping in your belly area (abdomen). GET HELP IF:  You have pain in your back or the area between your hip bones (pelvic area) that is  not controlled by medicines.  You have pain in your abdomen that is not controlled with medicines.  You have an increase in bleeding between and during periods.  You soak tampons or pads in a half hour or less.  You feel lightheaded.  You feel extra tired.  You feel weak. GET HELP RIGHT AWAY IF:   You pass out (faint).  You  have a sudden increase in pelvic pain.   This information is not intended to replace advice given to you by your health care provider. Make sure you discuss any questions you have with your health care provider.   Document Released: 08/20/2010 Document Revised: 08/08/2014 Document Reviewed: 01/14/2014 Elsevier Interactive Patient Education 2016 [employer]. Preterm Labor Information Preterm labor is when labor starts before you are [redacted] weeks pregnant. The normal length of pregnancy is 39 to 41 weeks.  CAUSES  The cause of preterm labor is not often known. The most common known cause is infection. RISK FACTORS  Having a history of preterm labor.  Having your water break before it should.  Having a placenta that covers the opening of the cervix.  Having a placenta that breaks away from the uterus.  Having a cervix that is too weak to hold the baby in the uterus.  Having too much fluid in the amniotic sac.  Taking drugs or smoking while pregnant.  Not gaining enough weight while pregnant.  Being younger than 58 and older than 31 years old.  Having a low income.  Being African American. SYMPTOMS  Period-like cramps, belly (abdominal) pain, or back pain.  Contractions that are regular, as often as six in an hour. They may be mild or painful.  Contractions that start at the top of the belly. They then move to the lower belly and back.  Lower belly pressure that seems to get stronger.  Bleeding from the vagina.  Fluid leaking from the vagina. TREATMENT  Treatment depends on:  Your condition.  The condition of your baby.  How many weeks pregnant you are. Your doctor may have you:  Take medicine to stop contractions.  Stay in bed except to use the restroom (bed rest).  Stay in the hospital. WHAT SHOULD YOU DO IF YOU THINK YOU ARE IN PRETERM LABOR? Call your doctor right away. You need to go to the hospital right away.  HOW CAN YOU PREVENT PRETERM LABOR IN FUTURE  PREGNANCIES?  Stop smoking, if you smoke.  Maintain healthy weight gain.  Do not take drugs or be around chemicals that are not needed.  Tell your doctor if you think you have an infection.  Tell your doctor if you had a preterm labor before.   This information is not intended to replace advice given to you by your health care provider. Make sure you discuss any questions you have with your health care provider.   Document Released: 10/14/2008 Document Revised: 12/02/2014 Document Reviewed: 08/20/2012 Elsevier Interactive Patient Education [employer].

## 2015-06-19 NOTE — MAU Note (Signed)
Is with child, doesn't know how far along she is.  Has not been seen yet, plans to go to [place], does not have appt.  Has been having pain in lower abd off and on since last night more  Apparent when gets up.

## 2015-08-06 ENCOUNTER — Inpatient Hospital Stay (HOSPITAL_COMMUNITY)
Admission: AD | Admit: 2015-08-06 | Discharge: 2015-08-06 | Discharge status: Against Medical Advice | Payer: Medicaid Other | Source: Ambulatory Visit | Attending: Family Medicine | Admitting: Family Medicine

## 2015-08-06 ENCOUNTER — Encounter (HOSPITAL_COMMUNITY): Payer: Self-pay

## 2015-08-06 DIAGNOSIS — O26892 Other specified pregnancy related conditions, second trimester: Secondary | ICD-10-CM | POA: Insufficient documentation

## 2015-08-06 DIAGNOSIS — R0602 Shortness of breath: Secondary | ICD-10-CM | POA: Diagnosis present

## 2015-08-06 DIAGNOSIS — R102 Pelvic and perineal pain: Secondary | ICD-10-CM | POA: Insufficient documentation

## 2015-08-06 DIAGNOSIS — Z3A27 27 weeks gestation of pregnancy: Secondary | ICD-10-CM | POA: Insufficient documentation

## 2015-08-06 DIAGNOSIS — O99012 Anemia complicating pregnancy, second trimester: Secondary | ICD-10-CM | POA: Insufficient documentation

## 2015-08-06 LAB — URINE MICROSCOPIC-ADD ON
Bacteria, UA: NONE SEEN
RBC / HPF: NONE SEEN RBC/hpf (ref 0–5)

## 2015-08-06 LAB — URINALYSIS, ROUTINE W REFLEX MICROSCOPIC
Bilirubin Urine: NEGATIVE
Glucose, UA: NEGATIVE mg/dL
Hgb urine dipstick: NEGATIVE
Ketones, ur: NEGATIVE mg/dL
Leukocytes, UA: NEGATIVE
Nitrite: NEGATIVE
Protein, ur: 30 mg/dL — AB
Specific Gravity, Urine: 1.025 (ref 1.005–1.030)
pH: 6 (ref 5.0–8.0)

## 2015-08-06 NOTE — MAU Provider Note (Signed)
History     CSN: [id number]  Arrival date and time: 08/06/15 1436   First Provider Initiated Contact with Patient 08/06/15 1513      Chief Complaint  Patient presents with  . Shortness of Breath   HPI  Judy Harris is a 30 yo [id number] at [redacted]w[redacted]d by 62w6day [place] presenting today with shortness of breath and abdominal pain.  Of note patient history of anemia (not currently on medications). Patient also has not had prenatal care during this pregnancy.   Patient notes of a 1 day history of intermittent shortness of breath. First episode started gradually during activity. The second episode was during activity but started more abruptly where she was "gasping for breath"; at that time she took a nap for ~ 1hr and when she woke up she felt drained. Third episode occurred this morning during work; symptoms occurred gradually. Sitting improved symptoms but still felt is if she was "panting". Patient states that symptoms seem to start when fetus is in a certain position; when fetus shifts position symptoms resolve. Patient denies chest pain, fevers, chills, cough, or increased LE edema.   She also notes of a 2 week history of intermittent pain in her lateral pelvic area bilaterally. Symptom occurs when patient stands from a sitting position and with ambulation. Denies dysuria.   Patient denies having contractions, leakage of fluid, or vaginal bleeding. Endorses fetal movement.   Of note patient has not had a prenatal care visit. She states that she initially was not sure about continuing this pregnancy, but recently has accepted the pregnancy. She has called [facility] to try to establish care but clinic has not called back yet.     Past Medical History  Diagnosis Date  . Anemia     Has taken Iron supp;is currently taking now  . Infection     Yeast inf;not freq  . 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 Infection     UTI;no freq  . SOB (shortness of breath) 2013    Since the preg;SOB @ times     Past Surgical History  Procedure Laterality Date  . Ectopic pregnancy surgery      Family History  Problem Relation Age of Onset  . Diabetes Maternal Grandmother   . Hypertension Maternal Grandmother   . Stroke Maternal Grandmother   . Hypertension Paternal Grandmother   . Diabetes Paternal Grandmother   . Heart disease Paternal Grandmother   . Cancer Paternal Aunt     Breast    Social History  Substance Use Topics  . Smoking status: Never Smoker   . Smokeless tobacco: Never Used  . Alcohol Use: Yes     Comment: Wine occasionally when not pregnant    Allergies: No Known Allergies  No prescriptions prior to admission    ROS As noted above. Physical Exam   Blood pressure 126/56, pulse 96, temperature 97.3 F (36.3 C), resp. rate 18, height 5\' 2"  (1.575 m), weight 75.116 kg (165 lb 9.6 oz), last menstrual period 04/06/2015, SpO2 100 %, unknown if currently breastfeeding.  Physical Exam  Constitutional: She is oriented to person, place, and time. She appears well-developed. No distress.  Cardiovascular: Normal rate and regular rhythm.   2/6 systolic murmur best heard in the left upper sternal border.    Respiratory: Effort normal and breath sounds normal. No respiratory distress. She has no wheezes. She has no rales.  GI: Soft. Bowel sounds are normal. There is no tenderness.  Fundal height: 26cm  Musculoskeletal: She exhibits no edema.  Neurological: She is alert and oriented to person, place, and time.  Skin: Skin is warm and dry. She is not diaphoretic.    MAU Course  Procedures: none   Assessment and Plan  Judy Harris is a 30 yo [id number] at [redacted]w[redacted]d by [redacted]w[redacted]d [place] with no PNC and with history of anemia who presents with shortness of breath and pelvic pain.  Shortness of breath likely due to physiologic SOB of pregnancy vs anemia as her previous CBC in 2015 showed hemoglobin of 8.1 (she is currently not taking iron supplements). Infectious process such as PNA  considered but not likely as patient is afebrile, lung exam is unremarkable, and denies URI symptoms. Cardiac etiology (ACS, Cardiomyopathy) considered but also less likely as patient lacks risk factors, denies chest pain, and does not have LE edema. PE also considered but not likely as patient is not hypoxic, tachypnic, does not have pleuritic chest pain, and does not have evidence of DVT on exam.  To further evaluate, will order CBC, ferritin, and BNP. Additionally will obtain initial prenatal labs and anatomy scan as patient has not had prenatal care thus far.   Patient left MAU AMA prior to obtaining labs/studies.   Smiley Houseman 08/06/2015, 3:59 PM   OB FELLOW MAU DISCHARGE ATTESTATION  I have seen and examined this patient; I agree with above documentation in the resident's note. Presenting w/ sob epidosic for past few days. None on presentation. No tachypnea or hypoxia, lungs clear, no URI symptoms - do not think infections. No signs of dvt, no hypoxia or tachypnea, no cough or pleuritic chest pain - low suspicion for PE. No chest pain or palpitations so low concern for ACS. Clear lungs, no PND, no LE swelling - low concern for CHF. Hx significant anemia, and now currently pregnant, so think anemia may very well be the cause. pyhsiologic changes of pregnancy likely also contribute. Patient left AMA prior to initiating w/u.   Desma Maxim, MD 8:31 AM

## 2015-08-06 NOTE — MAU Note (Addendum)
Pt stated last night she stared feeling SOB with and without activity.  SOB continued today. Pt also c/o pain in both her sides.

## 2015-08-06 NOTE — MAU Note (Signed)
Pt states she has to pick up her child and cannot stay. Signed AMA paperwork and states she will come back later.

## 2015-10-01 ENCOUNTER — Emergency Department (HOSPITAL_COMMUNITY)
Admission: EM | Admit: 2015-10-01 | Discharge: 2015-10-01 | Disposition: A | Discharge status: Home / Self Care | Payer: Medicaid Other | Source: Home / Self Care | Attending: Family Medicine | Admitting: Family Medicine

## 2015-10-01 ENCOUNTER — Encounter (HOSPITAL_COMMUNITY): Payer: Self-pay | Admitting: Emergency Medicine

## 2015-10-01 DIAGNOSIS — B86 Scabies: Secondary | ICD-10-CM | POA: Diagnosis not present

## 2015-10-01 MED ORDER — PERMETHRIN 5 % EX CREA: TOPICAL_CREAM | CUTANEOUS | Status: DC

## 2015-10-01 NOTE — ED Notes (Signed)
Pt here with small bumps, itching noticed 2days ago, now with spreading to upper right arm and R abdomen Bumps are turning into black spots States boyfriend had rash first Used some hydrocortisone cream with some relief

## 2015-10-01 NOTE — Discharge Instructions (Signed)

## 2015-10-01 NOTE — ED Provider Notes (Signed)
CSN: [id number]     Arrival date & time 10/01/15  1307 History   None    Chief Complaint  Patient presents with  . Rash   (Consider location/radiation/quality/duration/timing/severity/associated sxs/prior Treatment) HPI Comments: 30 year old female presents with what she believes to be bug bites of some sort. She started out with small pinpoint type lesions to the hands and then spreading to the forearms and other places to include the back. Right torso and lesser to the lower extremities. These areas are pruritic. Her son has a few  similar lesions. She did present to the doctor and was told that he may have eczema. The rash or lesions that this patient exhibits does not resemble eczema.    Past Medical History  Diagnosis Date  . Anemia     Has taken Iron supp;is currently taking now  . Infection     Yeast inf;not freq  . 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 Infection     UTI;no freq  . SOB (shortness of breath) 2013    Since the preg;SOB @ times   Past Surgical History  Procedure Laterality Date  . Ectopic pregnancy surgery     Family History  Problem Relation Age of Onset  . Diabetes Maternal Grandmother   . Hypertension Maternal Grandmother   . Stroke Maternal Grandmother   . Hypertension Paternal Grandmother   . Diabetes Paternal Grandmother   . Heart disease Paternal Grandmother   . Cancer Paternal Aunt     Breast   Social History  Substance Use Topics  . Smoking status: Never Smoker   . Smokeless tobacco: Never Used  . Alcohol Use: Yes     Comment: Wine occasionally when not pregnant   OB History    Gravida Para Term Preterm AB TAB SAB Ectopic Multiple Living   6 4 2 2 1   1  0 4     Review of Systems  Constitutional: Negative.   HENT: Negative.   Musculoskeletal: Negative.   Skin: Positive for rash.  Neurological: Negative.   All other systems reviewed and are negative.   Allergies  Review of patient's allergies indicates no known allergies.  Home  Medications   Prior to Admission medications   Medication Sig Start Date End Date Taking? Authorizing Provider  permethrin (ELIMITE) 5 % cream Apply to affected areas, rinse off in 8 hours 10/01/15   Janne Napoleon, NP   Meds Ordered and Administered this Visit  Medications - No data to display  BP 136/67 mmHg  Pulse 90  Temp(Src) 98.1 F (36.7 C) (Oral)  Resp 16  SpO2 97%  LMP 04/06/2015 No data found.   Physical Exam  Constitutional: She is oriented to person, place, and time. She appears well-developed and well-nourished. No distress.  Eyes: EOM are normal.  Neck: Normal range of motion. Neck supple.  Cardiovascular: Normal rate.   Pulmonary/Chest: Effort normal. No respiratory distress.  Musculoskeletal: She exhibits no edema.  Neurological: She is alert and oriented to person, place, and time. She exhibits normal muscle tone.  Skin: Skin is warm and dry.  There are several pinpoint papular type lesions to the hands, forearms, right upper back, lower back and lesser to the lower extremities. Isolated. No confluence. No drainage or bleeding. They are dark red or brown.  Psychiatric: She has a normal mood and affect.  Nursing note and vitals reviewed.   ED Course  Procedures (including critical care time)  Labs Review Labs Reviewed -  No data to display  Imaging Review No results found.   Visual Acuity Review  Right Eye Distance:   Left Eye Distance:   Bilateral Distance:    Right Eye Near:   Left Eye Near:    Bilateral Near:         MDM   1. Scabies    elemite as dir Scabies info    Janne Napoleon, NP 10/01/15 1507

## 2015-10-13 ENCOUNTER — Encounter: Payer: Self-pay | Admitting: Student

## 2015-10-13 ENCOUNTER — Encounter: Payer: Medicaid Other | Admitting: Student

## 2015-10-15 ENCOUNTER — Inpatient Hospital Stay (HOSPITAL_COMMUNITY): Payer: Medicaid Other | Admitting: Certified Registered Nurse Anesthetist

## 2015-10-15 ENCOUNTER — Inpatient Hospital Stay (HOSPITAL_COMMUNITY)
Admission: AD | Admit: 2015-10-15 | Discharge: 2015-10-18 | DRG: 765 | Disposition: A | Discharge status: Home / Self Care | Payer: Medicaid Other | Source: Ambulatory Visit | Attending: Obstetrics & Gynecology | Admitting: Obstetrics & Gynecology

## 2015-10-15 ENCOUNTER — Encounter (HOSPITAL_COMMUNITY): Payer: Self-pay | Admitting: *Deleted

## 2015-10-15 ENCOUNTER — Encounter (HOSPITAL_COMMUNITY)
Admission: AD | Disposition: A | Discharge status: Home / Self Care | Payer: Self-pay | Source: Ambulatory Visit | Attending: Obstetrics & Gynecology

## 2015-10-15 DIAGNOSIS — Z833 Family history of diabetes mellitus: Secondary | ICD-10-CM | POA: Diagnosis not present

## 2015-10-15 DIAGNOSIS — Z823 Family history of stroke: Secondary | ICD-10-CM | POA: Diagnosis not present

## 2015-10-15 DIAGNOSIS — O9081 Anemia of the puerperium: Secondary | ICD-10-CM | POA: Diagnosis not present

## 2015-10-15 DIAGNOSIS — Z98891 History of uterine scar from previous surgery: Secondary | ICD-10-CM

## 2015-10-15 DIAGNOSIS — D62 Acute posthemorrhagic anemia: Secondary | ICD-10-CM

## 2015-10-15 DIAGNOSIS — O26893 Other specified pregnancy related conditions, third trimester: Secondary | ICD-10-CM | POA: Diagnosis present

## 2015-10-15 DIAGNOSIS — Z3A37 37 weeks gestation of pregnancy: Secondary | ICD-10-CM

## 2015-10-15 DIAGNOSIS — O4593 Premature separation of placenta, unspecified, third trimester: Principal | ICD-10-CM | POA: Diagnosis present

## 2015-10-15 DIAGNOSIS — Z8249 Family history of ischemic heart disease and other diseases of the circulatory system: Secondary | ICD-10-CM

## 2015-10-15 LAB — RAPID URINE DRUG SCREEN, HOSP PERFORMED
AMPHETAMINES: NOT DETECTED
BARBITURATES: NOT DETECTED
Benzodiazepines: POSITIVE — AB
Cocaine: NOT DETECTED
OPIATES: NOT DETECTED
TETRAHYDROCANNABINOL: NOT DETECTED

## 2015-10-15 LAB — HEMOGLOBIN AND HEMATOCRIT, BLOOD
HEMATOCRIT: 17.9 % — AB (ref 36.0–46.0)
Hemoglobin: 5.8 g/dL — CL (ref 12.0–15.0)

## 2015-10-15 LAB — CBC
HEMATOCRIT: 20.1 % — AB (ref 36.0–46.0)
HEMOGLOBIN: 5.6 g/dL — AB (ref 12.0–15.0)
MCH: 17.7 pg — ABNORMAL LOW (ref 26.0–34.0)
MCHC: 27.9 g/dL — ABNORMAL LOW (ref 30.0–36.0)
MCV: 63.4 fL — AB (ref 78.0–100.0)
Platelets: 159 10*3/uL (ref 150–400)
RBC: 3.17 MIL/uL — AB (ref 3.87–5.11)
RDW: 21.4 % — ABNORMAL HIGH (ref 11.5–15.5)
WBC: 12.6 10*3/uL — AB (ref 4.0–10.5)

## 2015-10-15 LAB — RPR: RPR Ser Ql: NONREACTIVE

## 2015-10-15 LAB — PROTIME-INR
INR: 1.26 (ref 0.00–1.49)
PROTHROMBIN TIME: 15.9 s — AB (ref 11.6–15.2)

## 2015-10-15 LAB — APTT: APTT: 27 s (ref 24–37)

## 2015-10-15 LAB — PREPARE RBC (CROSSMATCH)

## 2015-10-15 LAB — FIBRINOGEN: Fibrinogen: 213 mg/dL (ref 204–475)

## 2015-10-15 SURGERY — Surgical Case
Anesthesia: Regional

## 2015-10-15 MED ORDER — LACTATED RINGERS IV SOLN
INTRAVENOUS | Status: DC | PRN
  Administered 2015-10-15: 07:00:00 via INTRAVENOUS

## 2015-10-15 MED ORDER — FAMOTIDINE IN NACL 20-0.9 MG/50ML-% IV SOLN
INTRAVENOUS | Status: AC
  Administered 2015-10-15: 20 mg via INTRAVENOUS
  Filled 2015-10-15: qty 50

## 2015-10-15 MED ORDER — MEPERIDINE HCL 25 MG/ML IJ SOLN: 6.2500 mg | INTRAMUSCULAR | Status: DC | PRN

## 2015-10-15 MED ORDER — ONDANSETRON HCL 4 MG/2ML IJ SOLN
INTRAMUSCULAR | Status: DC | PRN
  Administered 2015-10-15: 4 mg via INTRAVENOUS

## 2015-10-15 MED ORDER — OXYTOCIN 10 UNIT/ML IJ SOLN
40.0000 [IU] | INTRAVENOUS | Status: DC | PRN
  Administered 2015-10-15: 40 [IU] via INTRAVENOUS

## 2015-10-15 MED ORDER — LACTATED RINGERS IV SOLN
INTRAVENOUS | Status: DC
  Administered 2015-10-15 (×2): via INTRAVENOUS

## 2015-10-15 MED ORDER — MENTHOL 3 MG MT LOZG: 1.0000 | LOZENGE | OROMUCOSAL | Status: DC | PRN

## 2015-10-15 MED ORDER — METHYLERGONOVINE MALEATE 0.2 MG PO TABS: 0.2000 mg | ORAL_TABLET | ORAL | Status: DC | PRN

## 2015-10-15 MED ORDER — MIDAZOLAM HCL 2 MG/2ML IJ SOLN
INTRAMUSCULAR | Status: DC | PRN
  Administered 2015-10-15: 2 mg via INTRAVENOUS

## 2015-10-15 MED ORDER — SCOPOLAMINE 1 MG/3DAYS TD PT72
MEDICATED_PATCH | TRANSDERMAL | Status: AC
  Filled 2015-10-15: qty 1

## 2015-10-15 MED ORDER — PROPOFOL 10 MG/ML IV BOLUS
INTRAVENOUS | Status: DC | PRN
  Administered 2015-10-15: 180 mg via INTRAVENOUS

## 2015-10-15 MED ORDER — HYDROMORPHONE HCL 1 MG/ML IJ SOLN
1.0000 mg | INTRAMUSCULAR | Status: DC | PRN
  Administered 2015-10-15: 1 mg via INTRAVENOUS
  Filled 2015-10-15: qty 1

## 2015-10-15 MED ORDER — FENTANYL CITRATE (PF) 100 MCG/2ML IJ SOLN
25.0000 ug | INTRAMUSCULAR | Status: DC | PRN
  Administered 2015-10-15 (×4): 50 ug via INTRAVENOUS

## 2015-10-15 MED ORDER — CEFAZOLIN SODIUM-DEXTROSE 2-3 GM-% IV SOLR
INTRAVENOUS | Status: AC
  Filled 2015-10-15: qty 50

## 2015-10-15 MED ORDER — OXYTOCIN 10 UNIT/ML IJ SOLN: 2.5000 [IU]/h | INTRAMUSCULAR | Status: AC

## 2015-10-15 MED ORDER — OXYCODONE HCL 5 MG PO TABS
5.0000 mg | ORAL_TABLET | ORAL | Status: DC | PRN
Start: 2015-10-15 — End: 2015-10-18
  Administered 2015-10-15 – 2015-10-18 (×9): 5 mg via ORAL
  Filled 2015-10-15 (×9): qty 1

## 2015-10-15 MED ORDER — LANOLIN HYDROUS EX OINT: 1.0000 "application " | TOPICAL_OINTMENT | CUTANEOUS | Status: DC | PRN

## 2015-10-15 MED ORDER — MIDAZOLAM HCL 2 MG/2ML IJ SOLN
INTRAMUSCULAR | Status: AC
Start: 2015-10-15 — End: 2015-10-15
  Filled 2015-10-15: qty 2

## 2015-10-15 MED ORDER — FENTANYL CITRATE (PF) 250 MCG/5ML IJ SOLN
INTRAMUSCULAR | Status: AC
  Filled 2015-10-15: qty 5

## 2015-10-15 MED ORDER — WITCH HAZEL-GLYCERIN EX PADS: 1.0000 "application " | MEDICATED_PAD | CUTANEOUS | Status: DC | PRN

## 2015-10-15 MED ORDER — CEFAZOLIN SODIUM 1-5 GM-% IV SOLN
1.0000 g | Freq: Three times a day (TID) | INTRAVENOUS | Status: AC
  Administered 2015-10-15 – 2015-10-16 (×3): 1 g via INTRAVENOUS
  Filled 2015-10-15 (×3): qty 50

## 2015-10-15 MED ORDER — SUCCINYLCHOLINE CHLORIDE 20 MG/ML IJ SOLN
INTRAMUSCULAR | Status: DC | PRN
Start: 2015-10-15 — End: 2015-10-15
  Administered 2015-10-15: 120 mg via INTRAVENOUS

## 2015-10-15 MED ORDER — ACETAMINOPHEN 325 MG PO TABS
650.0000 mg | ORAL_TABLET | ORAL | Status: DC | PRN
  Administered 2015-10-15 – 2015-10-16 (×5): 650 mg via ORAL
  Filled 2015-10-15 (×5): qty 2

## 2015-10-15 MED ORDER — ZOLPIDEM TARTRATE 5 MG PO TABS: 5.0000 mg | ORAL_TABLET | Freq: Every evening | ORAL | Status: DC | PRN

## 2015-10-15 MED ORDER — SODIUM CHLORIDE 0.9 % IV SOLN
Freq: Once | INTRAVENOUS | Status: AC
  Administered 2015-10-16: 02:00:00 via INTRAVENOUS

## 2015-10-15 MED ORDER — PROMETHAZINE HCL 25 MG/ML IJ SOLN: 6.2500 mg | INTRAMUSCULAR | Status: DC | PRN

## 2015-10-15 MED ORDER — SUCCINYLCHOLINE CHLORIDE 20 MG/ML IJ SOLN
INTRAMUSCULAR | Status: AC
  Filled 2015-10-15: qty 1

## 2015-10-15 MED ORDER — OXYTOCIN 10 UNIT/ML IJ SOLN
INTRAMUSCULAR | Status: AC
  Filled 2015-10-15: qty 4

## 2015-10-15 MED ORDER — SODIUM CHLORIDE 0.9 % IV SOLN
Freq: Once | INTRAVENOUS | Status: AC
  Administered 2015-10-15: 20 mL via INTRAVENOUS

## 2015-10-15 MED ORDER — IBUPROFEN 600 MG PO TABS
600.0000 mg | ORAL_TABLET | Freq: Four times a day (QID) | ORAL | Status: DC
  Administered 2015-10-15 – 2015-10-18 (×12): 600 mg via ORAL
  Filled 2015-10-15 (×13): qty 1

## 2015-10-15 MED ORDER — PRENATAL MULTIVITAMIN CH
1.0000 | ORAL_TABLET | Freq: Every day | ORAL | Status: DC
  Administered 2015-10-16 – 2015-10-18 (×3): 1 via ORAL
  Filled 2015-10-15 (×3): qty 1

## 2015-10-15 MED ORDER — CEFAZOLIN SODIUM-DEXTROSE 2-3 GM-% IV SOLR
INTRAVENOUS | Status: DC | PRN
  Administered 2015-10-15: 2 g via INTRAVENOUS

## 2015-10-15 MED ORDER — METHYLERGONOVINE MALEATE 0.2 MG/ML IJ SOLN: 0.2000 mg | INTRAMUSCULAR | Status: DC | PRN

## 2015-10-15 MED ORDER — SIMETHICONE 80 MG PO CHEW: 80.0000 mg | CHEWABLE_TABLET | ORAL | Status: DC | PRN

## 2015-10-15 MED ORDER — FENTANYL CITRATE (PF) 100 MCG/2ML IJ SOLN
INTRAMUSCULAR | Status: DC | PRN
  Administered 2015-10-15: 100 ug via INTRAVENOUS
  Administered 2015-10-15: 50 ug via INTRAVENOUS
  Administered 2015-10-15: 100 ug via INTRAVENOUS

## 2015-10-15 MED ORDER — FENTANYL CITRATE (PF) 100 MCG/2ML IJ SOLN
INTRAMUSCULAR | Status: AC
  Administered 2015-10-15: 50 ug via INTRAVENOUS
  Filled 2015-10-15: qty 2

## 2015-10-15 MED ORDER — TETANUS-DIPHTH-ACELL PERTUSSIS 5-2.5-18.5 LF-MCG/0.5 IM SUSP: 0.5000 mL | Freq: Once | INTRAMUSCULAR | Status: DC

## 2015-10-15 MED ORDER — SIMETHICONE 80 MG PO CHEW
80.0000 mg | CHEWABLE_TABLET | ORAL | Status: DC
  Administered 2015-10-16 – 2015-10-17 (×2): 80 mg via ORAL
  Filled 2015-10-15 (×3): qty 1

## 2015-10-15 MED ORDER — SIMETHICONE 80 MG PO CHEW
80.0000 mg | CHEWABLE_TABLET | Freq: Three times a day (TID) | ORAL | Status: DC
  Administered 2015-10-15 – 2015-10-18 (×8): 80 mg via ORAL
  Filled 2015-10-15 (×10): qty 1

## 2015-10-15 MED ORDER — DIPHENHYDRAMINE HCL 25 MG PO CAPS
25.0000 mg | ORAL_CAPSULE | Freq: Four times a day (QID) | ORAL | Status: DC | PRN
  Administered 2015-10-16: 25 mg via ORAL
  Filled 2015-10-15: qty 1

## 2015-10-15 MED ORDER — SCOPOLAMINE 1 MG/3DAYS TD PT72
MEDICATED_PATCH | TRANSDERMAL | Status: DC | PRN
  Administered 2015-10-15: 1 via TRANSDERMAL

## 2015-10-15 MED ORDER — DIBUCAINE 1 % RE OINT: 1.0000 "application " | TOPICAL_OINTMENT | RECTAL | Status: DC | PRN

## 2015-10-15 MED ORDER — ONDANSETRON HCL 4 MG/2ML IJ SOLN
INTRAMUSCULAR | Status: AC
  Filled 2015-10-15: qty 2

## 2015-10-15 MED ORDER — LACTATED RINGERS IV BOLUS (SEPSIS)
500.0000 mL | Freq: Once | INTRAVENOUS | Status: AC
  Administered 2015-10-15: 500 mL via INTRAVENOUS

## 2015-10-15 MED ORDER — SENNOSIDES-DOCUSATE SODIUM 8.6-50 MG PO TABS
2.0000 | ORAL_TABLET | ORAL | Status: DC
  Administered 2015-10-16 – 2015-10-17 (×3): 2 via ORAL
  Filled 2015-10-15 (×3): qty 2

## 2015-10-15 MED ORDER — DEXAMETHASONE SODIUM PHOSPHATE 4 MG/ML IJ SOLN
INTRAMUSCULAR | Status: AC
  Filled 2015-10-15: qty 1

## 2015-10-15 MED ORDER — PROPOFOL 10 MG/ML IV BOLUS
INTRAVENOUS | Status: AC
  Filled 2015-10-15: qty 20

## 2015-10-15 MED ORDER — DEXAMETHASONE SODIUM PHOSPHATE 10 MG/ML IJ SOLN
INTRAMUSCULAR | Status: DC | PRN
  Administered 2015-10-15: 4 mg via INTRAVENOUS

## 2015-10-15 SURGICAL SUPPLY — 36 items
CLAMP CORD UMBIL (MISCELLANEOUS) IMPLANT
CLOTH BEACON ORANGE TIMEOUT ST (SAFETY) ×3 IMPLANT
DRSG OPSITE POSTOP 4X10 (GAUZE/BANDAGES/DRESSINGS) ×3 IMPLANT
DURAPREP 26ML APPLICATOR (WOUND CARE) ×6 IMPLANT
ELECT REM PT RETURN 9FT ADLT (ELECTROSURGICAL) ×3
ELECTRODE REM PT RTRN 9FT ADLT (ELECTROSURGICAL) ×1 IMPLANT
EXTRACTOR VACUUM BELL STYLE (SUCTIONS) IMPLANT
GLOVE BIOGEL PI IND STRL 7.0 (GLOVE) ×1 IMPLANT
GLOVE BIOGEL PI IND STRL 8 (GLOVE) ×1 IMPLANT
GLOVE BIOGEL PI INDICATOR 7.0 (GLOVE) ×2
GLOVE BIOGEL PI INDICATOR 8 (GLOVE) ×2
GLOVE ECLIPSE 8.0 STRL XLNG CF (GLOVE) ×3 IMPLANT
GOWN STRL REUS W/TWL LRG LVL3 (GOWN DISPOSABLE) ×6 IMPLANT
KIT ABG SYR 3ML LUER SLIP (SYRINGE) ×3 IMPLANT
LIQUID BAND (GAUZE/BANDAGES/DRESSINGS) ×6 IMPLANT
NEEDLE HYPO 18GX1.5 BLUNT FILL (NEEDLE) ×3 IMPLANT
NEEDLE HYPO 22GX1.5 SAFETY (NEEDLE) ×3 IMPLANT
NEEDLE HYPO 25X5/8 SAFETYGLIDE (NEEDLE) ×3 IMPLANT
NS IRRIG 1000ML POUR BTL (IV SOLUTION) ×3 IMPLANT
PACK C SECTION WH (CUSTOM PROCEDURE TRAY) ×3 IMPLANT
PAD OB MATERNITY 4.3X12.25 (PERSONAL CARE ITEMS) ×3 IMPLANT
PENCIL SMOKE EVAC W/HOLSTER (ELECTROSURGICAL) ×3 IMPLANT
RTRCTR C-SECT PINK 25CM LRG (MISCELLANEOUS) IMPLANT
STAPLER VISISTAT 35W (STAPLE) ×3 IMPLANT
SUT CHROMIC 0 CT 1 (SUTURE) ×3 IMPLANT
SUT MNCRL 0 VIOLET CTX 36 (SUTURE) ×2 IMPLANT
SUT MONOCRYL 0 CTX 36 (SUTURE) ×4
SUT PLAIN 2 0 (SUTURE)
SUT PLAIN 2 0 XLH (SUTURE) IMPLANT
SUT PLAIN ABS 2-0 CT1 27XMFL (SUTURE) IMPLANT
SUT VIC AB 0 CTX 36 (SUTURE) ×2
SUT VIC AB 0 CTX36XBRD ANBCTRL (SUTURE) ×1 IMPLANT
SUT VIC AB 4-0 KS 27 (SUTURE) IMPLANT
SYR 20CC LL (SYRINGE) ×6 IMPLANT
TOWEL OR 17X24 6PK STRL BLUE (TOWEL DISPOSABLE) ×3 IMPLANT
TRAY FOLEY CATH SILVER 14FR (SET/KITS/TRAYS/PACK) IMPLANT

## 2015-10-15 NOTE — Anesthesia Preprocedure Evaluation (Signed)
Anesthesia Evaluation  Patient identified by MRN, date of birth, ID band Patient awake and Patient confused    Reviewed: Allergy & Precautions, H&P , NPO status , Patient's Chart, lab work & pertinent test results  Airway Mallampati: II       Dental   Pulmonary    Pulmonary exam normal breath sounds clear to auscultation       Cardiovascular Exercise Tolerance: Good Normal cardiovascular exam Rhythm:regular Rate:Normal     Neuro/Psych    GI/Hepatic   Endo/Other    Renal/GU      Musculoskeletal   Abdominal   Peds  Hematology   Anesthesia Other Findings STAT CS, No labs, no pre natal care, probable abruption  Reproductive/Obstetrics (+) Pregnancy No pre natal care                             Anesthesia Physical Anesthesia Plan  ASA: II and emergent  Anesthesia Plan: General ETT   Post-op Pain Management:    Induction:   Airway Management Planned:   Additional Equipment:   Intra-op Plan:   Post-operative Plan:   Informed Consent: I have reviewed the patients History and Physical, chart, labs and discussed the procedure including the risks, benefits and alternatives for the proposed anesthesia with the patient or authorized representative who has indicated his/her understanding and acceptance.     Plan Discussed with:   Anesthesia Plan Comments:         Anesthesia Quick Evaluation

## 2015-10-15 NOTE — Anesthesia Procedure Notes (Signed)
Procedure Name: Intubation Date/Time: 10/15/2015 7:08 AM Performed by: Hewitt Blade Pre-anesthesia Checklist: Patient identified, Emergency Drugs available, Suction available and Patient being monitored Patient Re-evaluated:Patient Re-evaluated prior to inductionOxygen Delivery Method: Circle system utilized Preoxygenation: Pre-oxygenation with 100% oxygen Intubation Type: IV induction Ventilation: Mask ventilation without difficulty Laryngoscope Size: Mac and 3 Grade View: Grade II Tube type: Oral Tube size: 7.0 mm Number of attempts: 1 Airway Equipment and Method: Stylet Placement Confirmation: ETT inserted through vocal cords under direct vision,  positive ETCO2 and breath sounds checked- equal and bilateral Secured at: 23 cm Tube secured with: Tape Dental Injury: Teeth and Oropharynx as per pre-operative assessment

## 2015-10-15 NOTE — Transfer of Care (Signed)
Immediate Anesthesia Transfer of Care Note  Patient: Judy Harris  Procedure(s) Performed: Procedure(s): CESAREAN SECTION (N/A)  Patient Location: PACU  Anesthesia Type:General  Level of Consciousness: awake, alert  and sedated  Airway & Oxygen Therapy: Patient Spontanous Breathing and Patient connected to nasal cannula oxygen  Post-op Assessment: Report given to RN, Post -op Vital signs reviewed and stable and Patient moving all extremities  Post vital signs: Reviewed  Last Vitals: There were no vitals filed for this visit.  Complications: No apparent anesthesia complications

## 2015-10-15 NOTE — Consult Note (Signed)
[facility]  Delivery Note:  C-section       10/15/2015  7:36 AM  I was called to the operating room at the request of the patient's obstetrician (Dr. Elonda Husky) for a stat c-section for abruption and fetal distress.  PRENATAL HX:  This is a 30 y/o [id number] at 69 and 5/[redacted] weeks gestation who presented via EMS for sudden onset leakage of fluid and bleeding.  She had one visit to MAU at 20 weeks (her dating is based on an ultrasound at that time) but otherwise had no prenatal care.  At fetal HR in MAU was in the 100s so a stat c-section was called.  Minutes prior to the c-section fetal HR was in the 12s.  A complete abruption was noted at delivery.     DELIVERY:  Infant was floppy and apneic at delivery with no heart rate.  PPV initiated and after 1 minute of PPV, HR was in the 150s and oxygen saturations in the high 90s.  A pulse oximeter was applied and oxygen saturations in RA were in the high 80s by 5 minutes and in the low 90's by 10 minutes.  Tone somewhat low by 5 minutes and was normal by 10 minutes.  APGARs 0, 8, and 9.  Exam was within normal limits by 10 minutes of age.  A cord pH was 6.85, however given the rapid improvement of the infant and current clinical exam, she does not meet criteria for therapeutic hypothermia.  Should her neurological exam change, the NICU attending should be contacted immediately.    _____________________ Electronically Signed By: Clinton Gallant, MD Neonatologist

## 2015-10-15 NOTE — Anesthesia Postprocedure Evaluation (Signed)
Anesthesia Post Note  Patient: Judy Harris  Procedure(s) Performed: Procedure(s) (LRB): CESAREAN SECTION (N/A)  Patient location during evaluation: PACU Anesthesia Type: General Level of consciousness: awake and alert and awake Pain management: pain level controlled Vital Signs Assessment: post-procedure vital signs reviewed and stable Respiratory status: spontaneous breathing, nonlabored ventilation, respiratory function stable and patient connected to nasal cannula oxygen Cardiovascular status: blood pressure returned to baseline and stable Postop Assessment: no signs of nausea or vomiting Anesthetic complications: no Comments: Hbg 5.6, will give 2 units, keep in PACU until 2nd unit started, Patient hbg 8.0 one year ago ,appears to have chronic anemia    Last Vitals:  Filed Vitals:   10/15/15 0752  Temp: 36.3 C  Resp: 16    Last Pain: There were no vitals filed for this visit.               Alexis Frock

## 2015-10-15 NOTE — Anesthesia Postprocedure Evaluation (Signed)
Anesthesia Post Note  Patient: Judy Harris  Procedure(s) Performed: Procedure(s) (LRB): CESAREAN SECTION (N/A)  Patient location during evaluation: Mother Baby Anesthesia Type: General Level of consciousness: awake, awake and alert, oriented and patient cooperative Pain management: pain level controlled Vital Signs Assessment: post-procedure vital signs reviewed and stable Respiratory status: spontaneous breathing, nonlabored ventilation and respiratory function stable Cardiovascular status: stable Postop Assessment: no signs of nausea or vomiting Anesthetic complications: no    Last Vitals:  Filed Vitals:   10/15/15 1107 10/15/15 1140  BP: 135/91 140/98  Pulse: 92 77  Temp: 36.7 C 36.8 C  Resp: 20 18    Last Pain:  Filed Vitals:   10/15/15 1210  PainSc: 9                  Moataz Tavis L

## 2015-10-15 NOTE — Addendum Note (Signed)
Addendum  created 10/15/15 1301 by Raenette Rover, CRNA   Modules edited: Clinical Notes   Clinical Notes:  File: [id number]

## 2015-10-15 NOTE — MAU Note (Signed)
Pt presented on EMS for SROM and vaginal bleeding. Upon arrival, pt bleeding heavily from vagina. FHR dopplered at 82s to 63s. CNM in department and used ultrasound to verify and Dr. Elonda Husky called to bedside. Dr. Elonda Husky verified FHR bradycardia and called a stat cesarean section.

## 2015-10-15 NOTE — H&P (Signed)
Judy Harris is a 30 y.o. female [phone] @ 37.5wks by 20.6wk scan presenting via EMS for sudden onset leaking fluid and bleeding this morning. She has not received prenatal care this preg but her dating is by a 20wk scan during an MAU visit.  History OB History    Gravida Para Term Preterm AB TAB SAB Ectopic Multiple Living   6 4 2 2 1   1  0 4     Past Medical History  Diagnosis Date  . Anemia     Has taken Iron supp;is currently taking now  . Infection     Yeast inf;not freq  . 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 Infection     UTI;no freq  . SOB (shortness of breath) 2013    Since the preg;SOB @ times   Past Surgical History  Procedure Laterality Date  . Ectopic pregnancy surgery     Family History: family history includes Cancer in her paternal aunt; Diabetes in her maternal grandmother and paternal grandmother; Heart disease in her paternal grandmother; Hypertension in her maternal grandmother and paternal grandmother; Stroke in her maternal grandmother. Social History:  reports that she has never smoked. She has never used smokeless tobacco. She reports that she drinks alcohol. She reports that she does not use illicit drugs.   Prenatal Transfer Tool  Maternal Diabetes: No- not done Genetic Screening: Declined- not done Maternal Ultrasounds/Referrals: Normal Fetal Ultrasounds or other Referrals:  None Maternal Substance Abuse:  No Significant Maternal Medications:  None Significant Maternal Lab Results:  None Other Comments:  no prenatal care  ROS    Last menstrual period 04/06/2015, unknown if currently breastfeeding. Exam Physical Exam  Constitutional: She is oriented to person, place, and time. She appears well-developed.  HENT:  Head: Normocephalic.  Neck: Normal range of motion.  Cardiovascular: Normal rate.   Respiratory: Effort normal.  GI:  U/S: FHR <100  Genitourinary:  Bloody fluid in vault and on bed; cx 2/thick  Musculoskeletal: Normal range of  motion.  Neurological: She is alert and oriented to person, place, and time.  Skin: Skin is warm and dry.  Psychiatric: She has a normal mood and affect. Her behavior is normal. Thought content normal.    Prenatal labs: (pending) ABO, Rh:   Antibody:   Rubella:   RPR:    HBsAg:    HIV:    GBS:     Assessment/Plan: IUP@37 .5wks Fetal bradycardia Possible placental abruption  Dr Elonda Husky called to room stat Code Cesarean implemented   Serita Grammes CNM 10/15/2015, 7:20 AM

## 2015-10-15 NOTE — Op Note (Signed)
Preoperative diagnosis:  1.  Intrauterine pregnancy at [redacted]w[redacted]d  weeks gestation                                         2.  Placental abruption                                         3.  Fetal Bradycardia, persistent                                         4.  Minimal prenatal care   Postoperative diagnosis:  Same as above + complete placental abruption  Procedure:  Primary cesarean section  Surgeon:  Florian Buff MD  Assistant:    Anesthesia: Kathrene Alu  Findings:  Patient presented to the maternity admission via EMS.  She called because she was having abdominal pain and bleeding.   She had no formal prenatal care only 3 or 4 visits it appears to the maternity admission unit.  Upon presentation the fetal heart rate was in the 70s and I was called emergently to the room.  Sonogram confirmed that this was fetal heart rate, I performed at the bedside.  The patient was having a significant amount of vaginal bleeding.  Her cervix was too thick and high.  As a result I called a code cesarean section.  The patient denied drug abuse specifically cocaine. The patient was taken down to the operating room and underwent a suboptimal field prep general anesthesia and emergent cesarean  Over a low transverse incision was delivered a viable female at 15 with Apgars of 0 and 8 and 9 weighing pending lbs.  oz. Uterus, tubes and ovaries were all normal.  There were no other significant findings  Upon entry into the uterine cavity it was obvious that there was a large amount of blood after I delivered the baby and handed it to the NICU team there was essentially complete separation of the placenta and the retroplacental clot was actually larger than the placenta itself.  The placenta and the clot were sent to pathology for evaluation.  Description of operation:  Patient was taken to the operating room and placed in the supine position where she underwent a general endotracheal anesthetic.  I used Betadine and  did 2 abdominal preps certainly suboptimal no shaving. a Foley catheter was placed in the MAU. A Pfannenstiel skin incision was made and carried down sharply to the rectus fascia which was scored in the midline extended laterally. The fascia was taken off the muscles both superiorly and without difficulty. The muscles were divided.  The peritoneal cavity was entered.  Bladder blade was placed, no bladder flap was created.  A low transverse hysterotomy incision was made and delivered a viable female  infant at 28 with Apgars of 0 and 8 and 9 weighing lbs  oz.  Cord pH was obtained and was 6.82. The uterus was exteriorized. It was closed in 2 layers, the first being a running interlocking layer and the second being an imbricating layer using 0 monocryl on a CTX needle. There was good resulting hemostasis.  Specifically there was no evidence of a [place] uterus and it contracted down well  from the onset of Pitocin.   The uterus tubes and ovaries were all normal. Peritoneal cavity was irrigated vigorously. The muscles and peritoneum were reapproximated loosely. The fascia was closed using 0 Vicryl in running fashion. Subcutaneous tissue was made hemostatic and irrigated. The skin was closed using skin staples. Blood loss for the procedure was 500 cc. The patient received Ancef prophylactically. The patient was taken to the recovery room in good stable condition with all counts being correct x3.  EBL 500 cc  Thad Osoria H 10/15/2015 7:49 AM

## 2015-10-16 ENCOUNTER — Encounter (HOSPITAL_COMMUNITY): Payer: Self-pay | Admitting: Obstetrics & Gynecology

## 2015-10-16 LAB — CBC
HCT: 21.5 % — ABNORMAL LOW (ref 36.0–46.0)
HEMATOCRIT: 22.4 % — AB (ref 36.0–46.0)
HEMOGLOBIN: 7.2 g/dL — AB (ref 12.0–15.0)
Hemoglobin: 7 g/dL — ABNORMAL LOW (ref 12.0–15.0)
MCH: 23.6 pg — AB (ref 26.0–34.0)
MCH: 23.9 pg — AB (ref 26.0–34.0)
MCHC: 32.1 g/dL (ref 30.0–36.0)
MCHC: 32.6 g/dL (ref 30.0–36.0)
MCV: 73.4 fL — AB (ref 78.0–100.0)
MCV: 73.4 fL — AB (ref 78.0–100.0)
PLATELETS: 136 10*3/uL — AB (ref 150–400)
Platelets: 148 10*3/uL — ABNORMAL LOW (ref 150–400)
RBC: 3.05 MIL/uL — ABNORMAL LOW (ref 3.87–5.11)
RBC: 2.93 MIL/uL — AB (ref 3.87–5.11)
RDW: 23.8 % — AB (ref 11.5–15.5)
RDW: 23.7 % — AB (ref 11.5–15.5)
WBC: 11 10*3/uL — ABNORMAL HIGH (ref 4.0–10.5)
WBC: 12.3 10*3/uL — ABNORMAL HIGH (ref 4.0–10.5)

## 2015-10-16 LAB — PREPARE RBC (CROSSMATCH)

## 2015-10-16 MED ORDER — SODIUM CHLORIDE 0.9 % IV SOLN
Freq: Once | INTRAVENOUS | Status: AC
  Administered 2015-10-16: 05:00:00 via INTRAVENOUS

## 2015-10-16 MED ORDER — SALINE SPRAY 0.65 % NA SOLN
1.0000 | NASAL | Status: DC | PRN
  Administered 2015-10-16: 1 via NASAL
  Filled 2015-10-16: qty 44

## 2015-10-16 NOTE — Clinical Social Work Maternal (Signed)
CLINICAL SOCIAL WORK MATERNAL/CHILD NOTE  Patient Details  Name: Judy Harris MRN: [id number] Date of Birth: 20-Apr-1986  Date:  10/16/2015  Clinical Social Worker Initiating Note:  Lucita Ferrara MSW, LCSW Date/ Time Initiated:  10/16/15/1330     Child's Name:  Journey   Legal Guardian:  Joaquim Nam and Christie Beckers  Need for Interpreter:  None   Date of Referral:  10/15/15     Reason for Referral:  Late or No Prenatal Care    Referral Source:  [facility]   Address:  [place], [place] [address]  Phone number:  [phone]   Household Members:  Minor Children, Significant Other   Natural Supports (not living in the home):  Extended Family, Immediate Family   Professional Supports: None   Employment: Full-time   Type of Work:     Education:      Pensions consultant:  [employer]   Other Resources:  [employer]   Cultural/Religious Considerations Which May Impact Care:  None reported  Strengths:  Ability to meet basic needs , Pediatrician chosen , Home prepared for child    Risk Factors/Current Problems:   1. No prenatal care: Infant's UDS is negative, and umbilical cord is pending.  MOB denied substance use during the pregnancy.   Cognitive State:  Able to Concentrate , Alert , Goal Oriented , Linear Thinking    Mood/Affect:  Happy , Interested    CSW Assessment:  CSW received request for consult due to MOB not receiving prenatal care during the pregnancy.   MOB presented as easily engaged and receptive to the visit. She was in a pleasant mood, and displayed a full range in affect.  MOB expressed familiarity with CSW from 2015.  Majority of the assessment was completed with MOB alone, but FOB did join and participate when he returned to the room.   Little time and effort was required to establish rapport with MOB.  She began to openly process her thoughts and feelings secondary to her childbirth experience. She stated that it was "traumatic",  and began to discuss her experiences at home, and the process of having an emergency C-section.  MOB described the normative range of emotions due to the abruption, the infant's difficult transition, and her emergency C-section.   MOB was receptive to exploring how the birth trauma may impact her mental health as she transitions postpartum.  Despite the traumatic birth, MOB expressed deep gratitude for the infant's health and her current health.  She shared that it was scary and overwhelming, but feels blessed.  MOB reported that despite an inability to bond with the infant at birth due to her receiving general anesthesia and feeling sick, she has been able to bond with her and enjoy caring for her. MOB agreed to closely monitor her mental health due to the traumatic birth, and agreed to follow up with a medical provider if she notes mental health complications.  MOB denied prior history of a perinatal mood disorder, but acknowledged how this birth experience increases her risk.  Per MOB, she lives with the FOB, and her 4 other children (ages 58, 3,2,1).  MOB stated that she is grateful for the support of her immediate family and the FOB who are helping to care for her children while she is at the hospital. MOB confirmed that the home is prepared for the infant, and the FOB only needs to set up the crib prior to discharge.  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  MOB smiled when CSW inquired about what helps her to manage and cope with working full time and being a mother.  MOB recognized the strengths that she has, and reported that she sometimes wonders how she is able to manage it all.  MOB stated that she recently received advice that she needs to keep engaging in the same behaviors, since it seems to be working well for her. MOB presented with insight and awareness of the importance of engaging in self-care.   MOB confirmed no consistent prenatal care during this pregnancy.  MOB stated  that she had occasional visits to the [facility], and stated that she went to [facility] to learn the gender of the baby.  Per MOB, she never initiated consistent prenatal care since she and the FOB were unsure if they wanted to continue with the pregnancy.  MOB stated that she was first overwhelmed since she has young children close together in age, and reported that she questioned her ability to care for an infant.  MOB reported that she and the FOB decided to continue with the pregnancy at approximately 6 months, and became happy and excited.  MOB shared that she was then able to bond with the infant, and reported that she is glad that she did not terminate the pregnancy.   MOB stated that she is familiar with the hospital drug screen policy since she was informed in November 2015 after her previous child was born due to no prenatal care.  MOB denied any concerns about the drug screen policy, and denied substance use history.   CSW Plan/Description:   1. Patient/Family Education -- perinatal mood and anxiety disorders, hospital drug screen policy 2. Information/Referral to [employer]-- Feelings After Birth support group 3.  Infant's UDS is negative, umbilical cord is pending. CSW to monitor, and will refer to CPS if positive.  4. No Further Intervention Required/No Barriers to Discharge    Sharyl Nimrod 10/16/2015, 3:06 PM

## 2015-10-16 NOTE — Progress Notes (Signed)
Post Partum Day 1 Subjective: tolerating PO and + flatus. Still has foley catheter in, no spontaneous urine yet. No BM yet. Has not been able to ambulate but has been trying to sit and stand in room. Still has pelvic tenderness that improves with pain medications. Denies dizziness, lightheadedness, palpitations, or headache. Admits to some nasal congestion.   Objective: Blood pressure 143/83, pulse 69, temperature 98.4 F (36.9 C), temperature source Oral, resp. rate 18, last menstrual period 04/06/2015, SpO2 99 %, unknown if currently breastfeeding.  Physical Exam:  General: alert, cooperative, appears stated age and no distress Lochia: appropriate Uterine Fundus: firm, tender to palpation Incision: healing well, no significant drainage, bandage in place DVT Evaluation: Negative Homan's sign. No cords or calf tenderness. Calf/Ankle edema is present in bilateral feet/ankles.   Recent Labs  10/15/15 0725 10/15/15 2301  HGB 5.6* 5.8*  HCT 20.1* 17.9*    Assessment/Plan: POD 1 after stat pLTCS for complete abruption and fetal bradycardia. Will continue to keep on postpartum unit. S/p 4 units of pRBC over last 24 hours for blood loss anemia. Will check post transfusion CBC that will be collected around 1100. Continue PRN pain medications.   LOS: 1 day   Judy Harris 10/16/2015, 7:27 AM   I have seen and examined this patient and agree the above assessment.  Respiratory effort normal, lochia appropriate, legs negative,  pain level normal.  Harris,Judy Rana 10/20/2015 8:45 AM

## 2015-10-17 LAB — TYPE AND SCREEN
ABO/RH(D): O POS
Antibody Screen: NEGATIVE
UNIT DIVISION: 0
UNIT DIVISION: 0
Unit division: 0
Unit division: 0

## 2015-10-17 LAB — RAPID HIV SCREEN (HIV 1/2 AB+AG)
HIV 1/2 ANTIBODIES: NONREACTIVE
HIV-1 P24 Antigen - HIV24: NONREACTIVE

## 2015-10-17 LAB — HEPATITIS B SURFACE ANTIGEN: Hepatitis B Surface Ag: NEGATIVE

## 2015-10-17 MED ORDER — FERROUS SULFATE 325 (65 FE) MG PO TABS
325.0000 mg | ORAL_TABLET | Freq: Three times a day (TID) | ORAL | Status: DC
  Administered 2015-10-17 – 2015-10-18 (×4): 325 mg via ORAL
  Filled 2015-10-17 (×4): qty 1

## 2015-10-17 MED ORDER — DOCUSATE SODIUM 100 MG PO CAPS: 100.0000 mg | ORAL_CAPSULE | Freq: Two times a day (BID) | ORAL | Status: DC | PRN

## 2015-10-17 NOTE — Progress Notes (Signed)
Post Partum Day 2 Subjective: no complaints, up ad lib, voiding, tolerating PO and + flatus  Objective: Blood pressure 124/80, pulse 78, temperature 98.3 F (36.8 C), temperature source Oral, resp. rate 18, last menstrual period 04/06/2015, SpO2 99 %, unknown if currently breastfeeding.  Physical Exam:  General: alert, cooperative and no distress Lochia: appropriate Uterine Fundus: firm, still mildly tender Incision: healing well, no significant drainage, no significant erythema DVT Evaluation: Negative Homan's sign. No cords or calf tenderness. No significant calf/ankle edema.   Recent Labs  10/16/15 1107 10/16/15 1936  HGB 7.2* 7.0*  HCT 22.4* 21.5*    Assessment/Plan: Plan for discharge tomorrow  Routine PP care   LOS: 2 days   Carlyle Dolly 10/17/2015, 9:05 AM   OB fellow attestation:  I have seen and examined this patient; I agree with above documentation in the resident's note.   Caren Macadam, MD 8:24 AM

## 2015-10-18 MED ORDER — FERROUS SULFATE 325 (65 FE) MG PO TABS: 325.0000 mg | ORAL_TABLET | Freq: Two times a day (BID) | ORAL | Status: DC

## 2015-10-18 MED ORDER — BISACODYL 10 MG RE SUPP
10.0000 mg | Freq: Once | RECTAL | Status: AC
Start: 2015-10-18 — End: 2015-10-18
  Administered 2015-10-18: 10 mg via RECTAL
  Filled 2015-10-18: qty 1

## 2015-10-18 MED ORDER — FERROUS SULFATE 325 (65 FE) MG PO TABS: 325.0000 mg | ORAL_TABLET | Freq: Three times a day (TID) | ORAL | Status: DC

## 2015-10-18 MED ORDER — OXYCODONE-ACETAMINOPHEN 5-325 MG PREPACK: 1.0000 | ORAL_TABLET | Freq: Four times a day (QID) | ORAL | Status: DC | PRN

## 2015-10-18 NOTE — Discharge Summary (Signed)
    OB Discharge Summary     Patient Name: Judy Harris DOB: Apr 11, 1986 MRN: [id number]  Date of admission: 10/15/2015 Delivering MD: Tania Ade H   Date of discharge: 10/18/2015  Admitting diagnosis: 36 WEEKS BLEEDING Intrauterine pregnancy: [redacted]w[redacted]d     Secondary diagnosis:  Active Problems:   S/P cesarean section  Additional problems:      Discharge diagnosis: Term Pregnancy Delivered via pLTCS for complete abruption and fetal bradycardia                                                                                              Post partum procedures:blood transfusion s/p 4 units of PRBC  Augmentation: None  Complications: Placental Abruption and Hemorrhage>1029mL  Hospital course:  Onset of Labor With Unplanned C/S  30 y.o. yo [id number] at [redacted]w[redacted]d was bib EMS and admitted in with vaginal bleeding 2/2 placental abruption and fetal bbradycardia on 10/15/2015. Membrane Rupture Time/Date: 7:09 AM ,10/15/2015    The patient went for cesarean section due to Non-Reassuring FHR 2/2 complete placental abruption, and delivered a Viable infant,10/15/2015  Details of operation can be found in separate operative note. Complicated by blood loss and received 4 units of PRBCs.  She is ambulating,tolerating a regular diet, passing flatus, and urinating well.  Patient is discharged home in stable condition 10/18/2015.  Physical exam  Filed Vitals:   10/16/15 1819 10/17/15 0638 10/17/15 1800 10/18/15 0510  BP: 135/89 124/80 133/81 120/65  Pulse:  78 74 80  Temp: 98.2 F (36.8 C) 98.3 F (36.8 C)  97.6 F (36.4 C)  TempSrc: Oral Oral  Oral  Resp: 20 18 18 18   SpO2:   100%    General: alert and cooperative Lochia: appropriate Uterine Fundus: firm Incision: Healing well with no significant drainage DVT Evaluation: No evidence of DVT seen on physical exam. Labs: Lab Results  Component Value Date   WBC 12.3* 10/16/2015   HGB 7.0* 10/16/2015   HCT 21.5* 10/16/2015   MCV 73.4* 10/16/2015    PLT 136* 10/16/2015   No flowsheet data found.  Discharge instruction: per After Visit Summary and "Baby and Me Booklet".  After visit meds:    Medication List    Notice    You have not been prescribed any medications.      Diet: routine diet  Activity: Advance as tolerated. Pelvic rest for 6 weeks.   Outpatient follow up:6 weeks Follow up Appt:No future appointments. Follow up Visit:No Follow-up on file.  Postpartum contraception: Undecided  Newborn Data: Live born female  Birth Weight: 5 lb 12.6 oz (2625 g) APGAR: 0, 8  Baby Feeding: Bottle Disposition:home with mother   10/18/2015 Michael J Bolivia, MD

## 2015-10-18 NOTE — Progress Notes (Signed)
CSW received call from bedside RN with report of concerns from nightshift RN.  CSW reviewed CSW note from yesterday as well as nightshift documentation.  CSW met with parents to see how they are coping at this time.  Both parents appear to be in good spirits and report that they are well.  They state they will be discharged today and feel ready.  MOB was holding baby and suggested to FOB that she may want more to eat.  FOB gladly got a bottle for her.  CSW asked how their night went and MOB reports to being in a lot of pain due to the c-section and remarked that it was an emergency c-section and her first.  FOB states commitment to helping her recover at home.  MOB states, "he's been superman."  CSW notes no tension in the room.  Parents declines questions, needs or concerns at this time.  CSW identifies no barriers to discharge.

## 2015-10-18 NOTE — Progress Notes (Signed)
Mother called out to nursing station, the secretary asked if the nurse could bring her anything and mother's response was no, when I arrived to room, mother was holding her forehead as if she was flustered, FOB was in room changing infants diaper, I asked mother if there was anything I could do for her or the infant and she stated no and then was silent for about a minute or two, FOB told mother to go to sleep in a short tone of voice, I again asked mother if there was anything she needed and she then asked for pain medication, FOB again repeated himself again, "go to sleep", talking to mother, I asked mother if there was anything else I could do for her and she stated she was just frustrated but would not explain why, when I left the room after giving mother her pain medication FOB was sitting on the couch feeding the infant, I informed MOB and FOB if they needed anything else to call out.

## 2015-10-18 NOTE — Progress Notes (Signed)
Patient had her first c/s this time.  Patient took shower and then this RN changed patient's honey comb dressing.  Patient has staples in place, which have been left in place per MD.  Patient has been instructed to return to MD to have staples removed in one week.  Patient able to teach back care of surgical site.  Patient has no further questions.

## 2015-10-23 ENCOUNTER — Ambulatory Visit: Payer: Medicaid Other

## 2015-10-23 VITALS — BP 149/97 | HR 97 | Wt 174.9 lb

## 2015-10-23 DIAGNOSIS — Z4802 Encounter for removal of sutures: Secondary | ICD-10-CM

## 2015-10-23 NOTE — Progress Notes (Signed)
Pt here today for removal of staples s/p c-section on 10/15/15.  Cleansed area with NS, removed staples, and cleansed incision site again with NS.  Incision well-approximated, no odor, and no drainage.  Notified Dr. Ihor Dow of pt's elevated BP.  Provider recommended to have f/u BP check in two weeks and sooner if she starts to have any headaches or blurred vision.  Pt agreed to providers recommendations.

## 2015-11-10 IMAGING — US US OB COMP +14 WK
2 series · 16 of 28 positions shown · non-contrast
Comparison: none

[Series 1: us ob comp +14 wk · 78 acquisitions, 15 frames shown (1 of 2)]
[im 1/78]
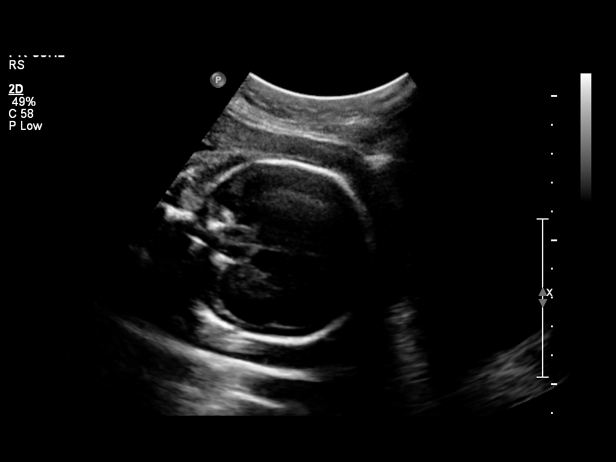
[im 6/78]
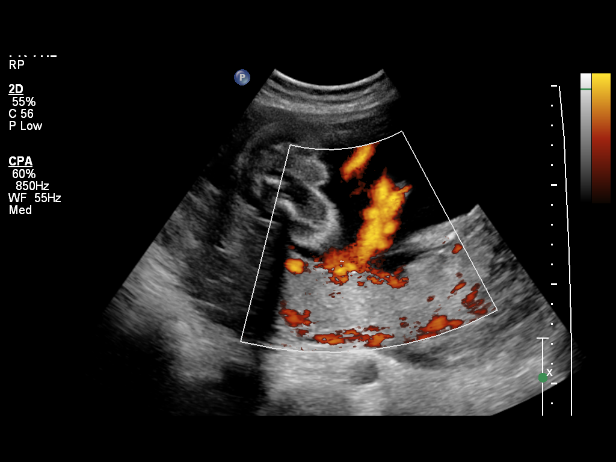
[im 12/78]
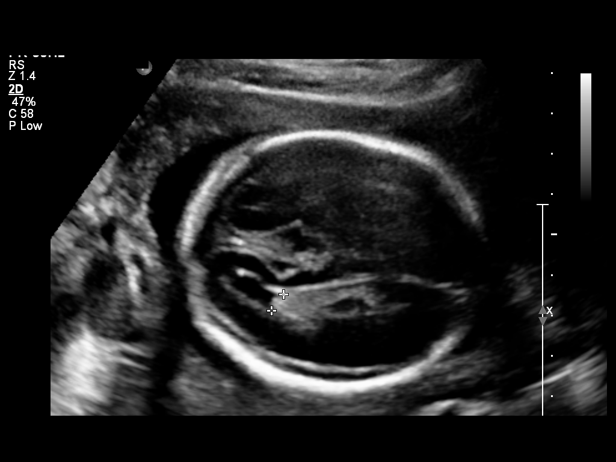
[im 18/78]
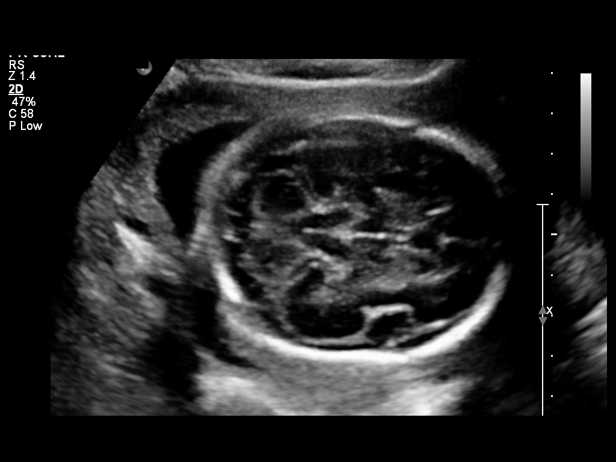
[im 21/78]
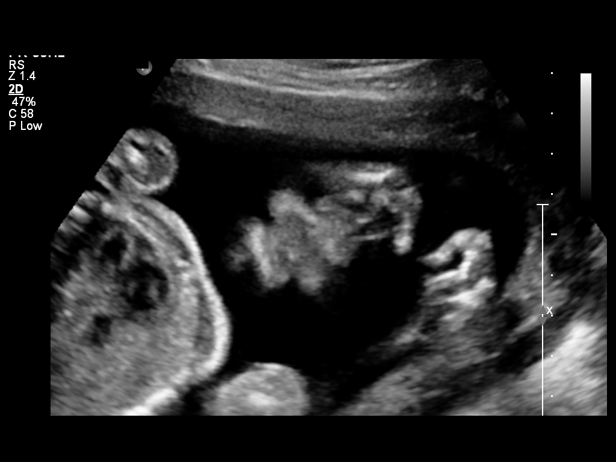
[im 27/78]
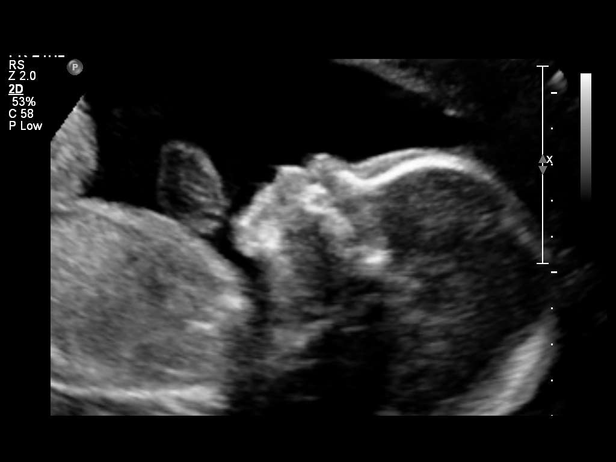
[im 33/78]
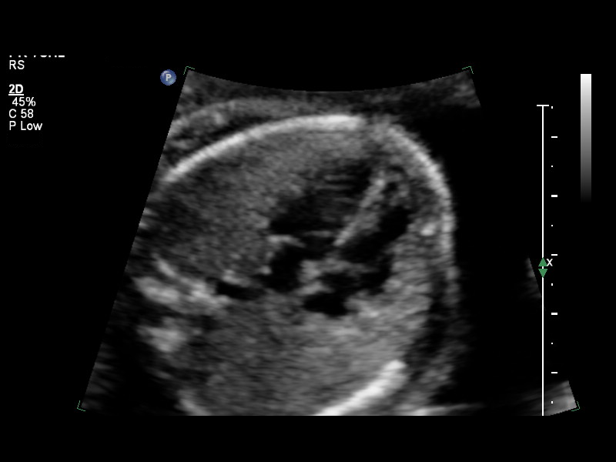
[im 39/78]
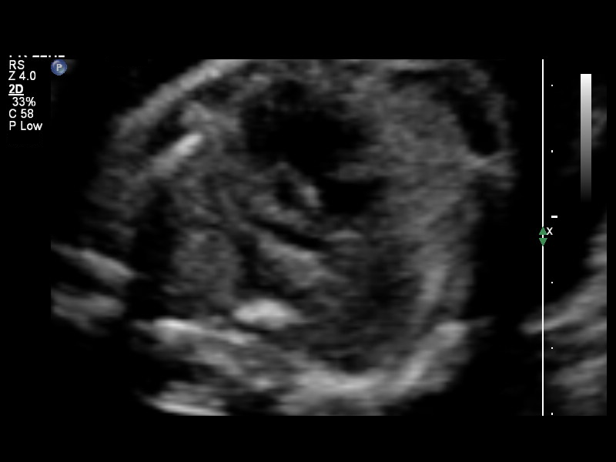
[im 42/78]
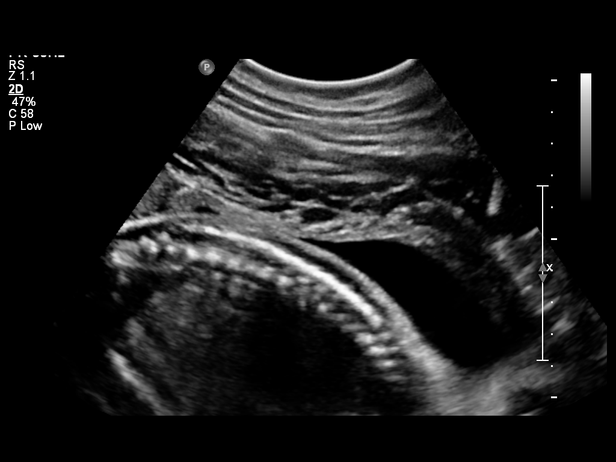
[im 48/78]
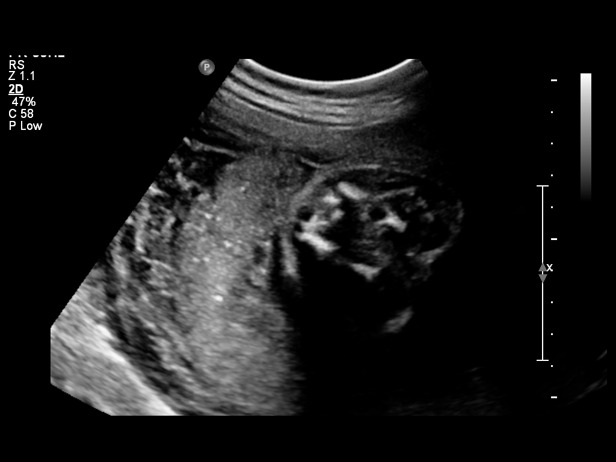
[im 54/78]
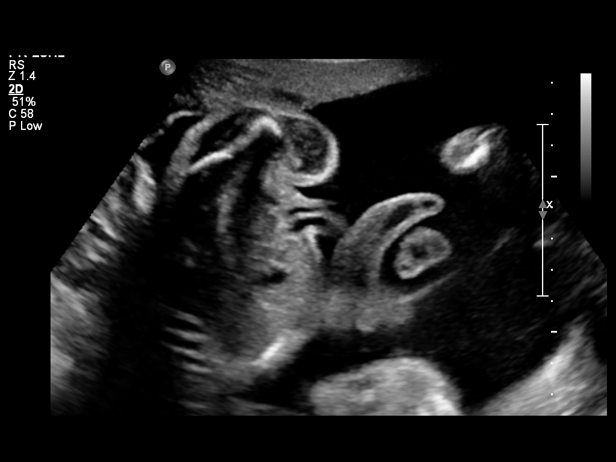
[im 60/78]
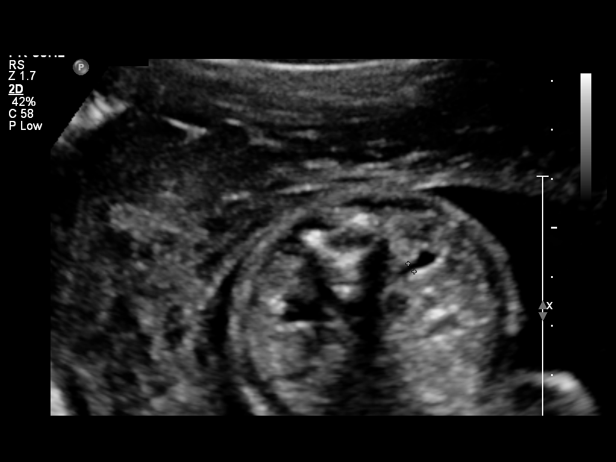
[im 63/78]
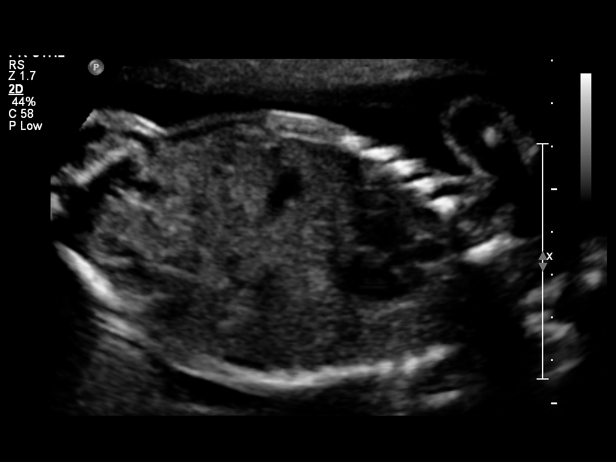
[im 69/78]
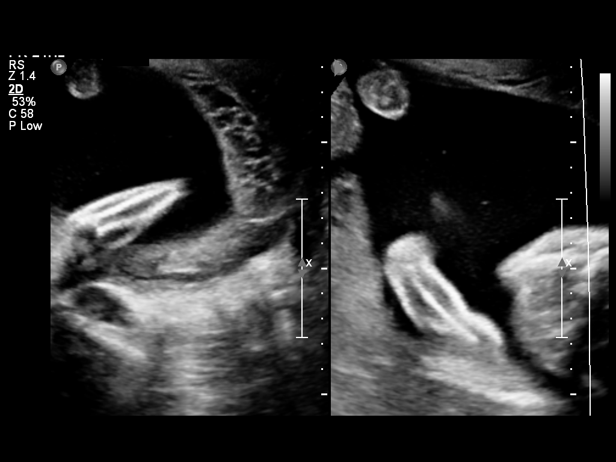
[im 75/78]
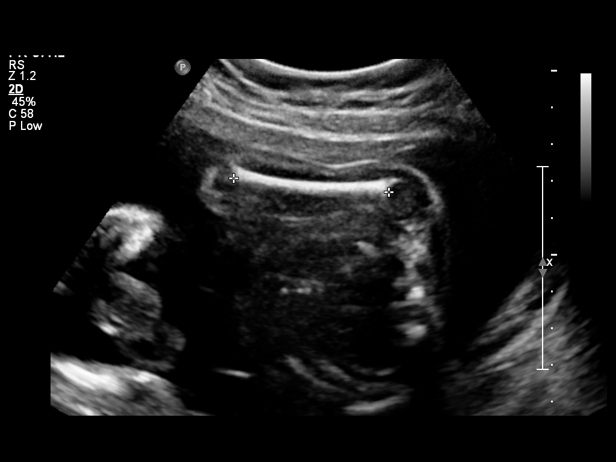

[Series 1: us ob comp +14 wk · 1 of 1 slices shown (2 of 2)]
[im 1/1]
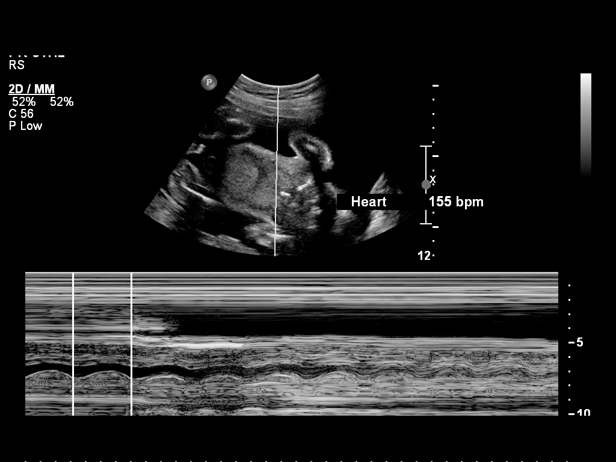

[16 of 28 positions shown; findings below may reference images not displayed]

OBSTETRICS REPORT
                      (Signed Final 02/26/2014 [DATE])

Service(s) Provided

 US OB COMP + 14 WK                                    76805.1
Indications

 Poor obstetric history: Previous preterm delivery
 (35 weeks, 36 weeks)
 Size-Date Discrepancy
 Basic anatomic survey
Fetal Evaluation

 Num Of Fetuses:    1
 Fetal Heart Rate:  155                          bpm
 Cardiac Activity:  Observed
 Presentation:      Cephalic
 Placenta:          Posterior, above cervical
                    os
 P. Cord            Visualized, central
 Insertion:

 Amniotic Fluid
 AFI FV:      Subjectively within normal limits
                                             Larg Pckt:    5.94  cm
 RUQ:   5.94    cm
Biometry

 BPD:     61.2  mm     G. Age:  24w 6d                CI:        71.32   70 - 86
                                                      FL/HC:      20.0   18.7 -

 HC:     230.8  mm     G. Age:  25w 1d       28  %    HC/AC:      1.12   1.04 -

 AC:     206.9  mm     G. Age:  25w 2d       45  %    FL/BPD:     75.3   71 - 87
 FL:      46.1  mm     G. Age:  25w 2d       42  %    FL/AC:      22.3   20 - 24
 HUM:     42.9  mm     G. Age:  25w 5d       57  %
 Est. FW:     787  gm    1 lb 12 oz      55  %
Gestational Age

 LMP:           17w 3d        Date:  10/27/13                 EDD:   08/03/14
 U/S Today:     25w 1d                                        EDD:   06/10/14
 Best:          25w 1d     Det. By:  U/S (02/26/14)           EDD:   06/10/14
Anatomy
 Cranium:          Appears normal         Aortic Arch:      Appears normal
 Fetal Cavum:      Appears normal         Ductal Arch:      Appears normal
 Ventricles:       Appears normal         Diaphragm:        Appears normal
 Choroid Plexus:   Appears normal         Stomach:          Appears normal, left
                                                            sided
 Cerebellum:       Appears normal         Abdomen:          Appears normal
 Posterior Fossa:  Appears normal         Abdominal Wall:   Appears nml (cord
                                                            insert, abd wall)
 Nuchal Fold:      Appears normal         Cord Vessels:     Appears normal (3
                                                            vessel cord)
 Face:             Appears normal         Kidneys:          Right sided
                   (orbits and profile)
                                                            pyelectasis, 6mm
 Lips:             Appears normal         Bladder:          Appears normal
 Heart:            Appears normal         Spine:            Appears normal
                   (4CH, axis, and
                   situs)
 RVOT:             Appears normal         Lower             Appears normal
                                          Extremities:
 LVOT:             Appears normal         Upper             Appears normal
                                          Extremities:

 Other:  Fetus appears to be a male. Heels visualized. Nasal bone visualized.
Targeted Anatomy

 Fetal Central Nervous System
 Lat. Ventricles:
Cervix Uterus Adnexa

 Cervical Length:    4.47     cm

 Cervix:       Normal appearance by transabdominal scan.
 Left Ovary:    Within normal limits.
 Right Ovary:   Within normal limits.
 Adnexa:     No abnormality visualized.
Comments

 Mild urinary tract (UT) dilation, formerly known as pyelectasis,
 was identified. Isolated UT dilation, or mild hydronephrosis, is
 observed in about 1 - 2 % of pregnancies at around 20 weeks
 of gestation. Most instances are of no pathological
 significance. Classification is based upon AP renal pelvic
 diameter, calyceal dilation, renal parenchymal thickness and
 appearance, bladder abnormalities, and ureteral
 abnormalities. Based upon today's images, the renal pelvis
 AP diameter measurement of 6mm for the right, presence of
 (normal) renal parenchyma and calyceal architecture
 constitute UT dilation as an indication for reassessment
 around 30-32 weeks.
 Targeted examination of the remaining fetal anatomy was
 performed and no other dysmorphic features, or morphologic
 "soft markers" associated with aneuploidy, were
 detected.When other abnormalities are present there is a
 significantly increased risk of attendant chromosomal defects,
 usually trisomy 21. However, in cases of isolated pyelectasis
 there remains some controversy about the true risk
 adjustment for trisomy 21, with investigators reporting 0 to
 1.5-fold increase over the background risk.
Impression

 SIUP at 71w3d (remote read)
 EFW 55th%
 Apparently isolated right sided urinary tract dilation (6mm)
 No previa
Recommendations

 Recommend follow up assessment at 32 weeks.  If UTD
 remains, pediatric urology consultation and postnatal
 evaluation will be warranted.

## 2015-11-20 ENCOUNTER — Ambulatory Visit: Payer: Medicaid Other | Admitting: Family Medicine

## 2016-11-05 ENCOUNTER — Emergency Department (HOSPITAL_COMMUNITY)
Admission: EM | Admit: 2016-11-05 | Discharge: 2016-11-05 | Disposition: A | Discharge status: Home / Self Care | Payer: Medicaid Other | Attending: Emergency Medicine | Admitting: Emergency Medicine

## 2016-11-05 ENCOUNTER — Encounter (HOSPITAL_COMMUNITY): Payer: Self-pay | Admitting: *Deleted

## 2016-11-05 DIAGNOSIS — M7661 Achilles tendinitis, right leg: Secondary | ICD-10-CM | POA: Diagnosis not present

## 2016-11-05 DIAGNOSIS — M25571 Pain in right ankle and joints of right foot: Secondary | ICD-10-CM | POA: Diagnosis present

## 2016-11-05 MED ORDER — IBUPROFEN 600 MG PO TABS: 600.0000 mg | ORAL_TABLET | Freq: Four times a day (QID) | ORAL | 0 refills | Status: DC | PRN

## 2016-11-05 MED ORDER — IBUPROFEN 400 MG PO TABS
600.0000 mg | ORAL_TABLET | Freq: Once | ORAL | Status: AC
  Administered 2016-11-05: 600 mg via ORAL
  Filled 2016-11-05: qty 1

## 2016-11-05 NOTE — ED Provider Notes (Signed)
[facility] DEPT Provider Note   CSN: [id number] Arrival date & time: 11/05/16  1221     History   Chief Complaint Chief Complaint  Patient presents with  . Ankle Pain    HPI Judy Harris is a 31 y.o. female who presents with right ankle pain. No significant PMH. She states that yesterday she noticed posterior ankle pain. This gradually worsened and today it became more severe so she came to the ED. She denies any new exercises however she states she "disciplined" her oldest child and may have injured it doing that. 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 Denies inability to walk, numbness.  HPI  Past Medical History:  Diagnosis Date  . Anemia    Has taken Iron supp;is currently taking now  . Infection    Yeast inf;not freq  . Infection    BV;freq in past  . Infection    UTI;no freq  . SOB (shortness of breath) 2013   Since the preg;SOB @ times    Patient Active Problem List   Diagnosis Date Noted  . S/P cesarean section 10/15/2015  . Active labor 06/16/2014  . Supervision of normal pregnancy in second trimester 02/18/2014  . Anemia 03/01/2012  . Preterm delivery, delivered 03/01/2012    Past Surgical History:  Procedure Laterality Date  . CESAREAN SECTION N/A 10/15/2015   Procedure: CESAREAN SECTION;  Surgeon: Florian Buff, MD;  Location: [facility];  Service: Obstetrics;  Laterality: N/A;  . ECTOPIC PREGNANCY SURGERY      OB History    Gravida Para Term Preterm AB Living   6 5 3 2 1 5    SAB TAB Ectopic Multiple Live Births       1 0 5       Home Medications    Prior to Admission medications   Medication Sig Start Date End Date Taking? Authorizing Provider  ferrous sulfate 325 (65 FE) MG tablet Take 1 tablet (325 mg total) by mouth 3 (three) times daily with meals. 10/18/15   Michael J Bolivia, MD  ferrous sulfate 325 (65 FE) MG tablet Take 1 tablet (325 mg total) by mouth 2 (two) times daily with a meal. 10/18/15  10/17/16  Keitha Butte, CNM  oxyCODONE-acetaminophen (PERCOCET) 5-325 mg TABS tablet Take 6 tablets by mouth every 6 (six) hours as needed. 10/18/15   Michael J Bolivia, MD    Family History Family History  Problem Relation Age of Onset  . Diabetes Maternal Grandmother   . Hypertension Maternal Grandmother   . Stroke Maternal Grandmother   . Hypertension Paternal Grandmother   . Diabetes Paternal Grandmother   . Heart disease Paternal Grandmother   . Cancer Paternal Aunt     Breast    Social History Social History  Substance Use Topics  . Smoking status: Never Smoker  . Smokeless tobacco: Never Used  . Alcohol use Yes     Comment: Wine occasionally when not pregnant     Allergies   Patient has no known allergies.   Review of Systems Review of Systems  Musculoskeletal: Positive for arthralgias. Negative for gait problem and myalgias.  Neurological: Negative for numbness.     Physical Exam Updated Vital Signs BP 115/68 (BP Location: Right Arm)   Pulse 86   Temp 98.3 F (36.8 C) (Oral)   Resp 16   LMP 10/05/2016   SpO2 100%   Physical Exam  Constitutional: She is  oriented to person, place, and time. She appears well-developed and well-nourished. No distress.  HENT:  Head: Normocephalic and atraumatic.  Eyes: Conjunctivae are normal. Pupils are equal, round, and reactive to light. Right eye exhibits no discharge. Left eye exhibits no discharge. No scleral icterus.  Neck: Normal range of motion.  Cardiovascular: Normal rate.   Pulmonary/Chest: Effort normal. No respiratory distress.  Abdominal: She exhibits no distension.  Musculoskeletal:  Right ankle: No obvious swelling or deformity. Tenderness to palpation of Achilles tendon. Pain with ROM of ankle. N/V intact.   Neurological: She is alert and oriented to person, place, and time.  Skin: Skin is warm and dry.  Psychiatric: She has a normal mood and affect. Her behavior is normal.  Nursing note and vitals  reviewed.    ED Treatments / Results  Labs (all labs ordered are listed, but only abnormal results are displayed) Labs Reviewed - No data to display  EKG  EKG Interpretation None       Radiology No results found.  Procedures Procedures (including critical care time)  Medications Ordered in ED Medications  ibuprofen (ADVIL,MOTRIN) tablet 600 mg (not administered)     Initial Impression / Assessment and Plan / ED Course  I have reviewed the triage vital signs and the nursing notes.  Pertinent labs & imaging results that were available during my care of the patient were reviewed by me and considered in my medical decision making (see chart for details).  31 year old female with achilles tendonitis. Advised RICE protocol. Will provide brace and course of NSAIDS. Follow up with PCP.   Final Clinical Impressions(s) / ED Diagnoses   Final diagnoses:  Achilles tendinitis of right lower extremity    New Prescriptions New Prescriptions   No medications on file     Recardo Evangelist, PA-C 11/05/16 [place], MD 11/06/16 1622

## 2016-11-05 NOTE — ED Triage Notes (Signed)
Pt reports right ankle pain since yesterday, denies injury.

## 2016-11-05 NOTE — Discharge Instructions (Signed)
Rest - please stay off ankle as much as possible °Ice - ice for 20 minutes at a time, several times a day °Compression - wear brace to provide support °Elevate - elevate ankle above level of heart °Ibuprofen - take with food. Take up to 3-4 times daily ° °

## 2016-11-05 NOTE — ED Notes (Signed)
See PAs notes for secondary assessment.

## 2018-05-28 ENCOUNTER — Encounter: Payer: Self-pay | Admitting: *Deleted

## 2018-11-30 ENCOUNTER — Encounter: Payer: Self-pay | Admitting: *Deleted

## 2020-07-29 ENCOUNTER — Other Ambulatory Visit: Payer: Self-pay

## 2020-07-29 ENCOUNTER — Encounter (HOSPITAL_COMMUNITY): Payer: Self-pay

## 2020-07-29 ENCOUNTER — Emergency Department (HOSPITAL_COMMUNITY)
Admission: EM | Admit: 2020-07-29 | Discharge: 2020-07-30 | Disposition: A | Discharge status: Home / Self Care | Payer: Medicaid Other | Attending: Emergency Medicine | Admitting: Emergency Medicine

## 2020-07-29 DIAGNOSIS — D649 Anemia, unspecified: Secondary | ICD-10-CM

## 2020-07-29 DIAGNOSIS — Z3A11 11 weeks gestation of pregnancy: Secondary | ICD-10-CM | POA: Diagnosis not present

## 2020-07-29 DIAGNOSIS — O99011 Anemia complicating pregnancy, first trimester: Secondary | ICD-10-CM | POA: Insufficient documentation

## 2020-07-29 DIAGNOSIS — Z3491 Encounter for supervision of normal pregnancy, unspecified, first trimester: Secondary | ICD-10-CM

## 2020-07-29 LAB — CBC WITH DIFFERENTIAL/PLATELET
Abs Immature Granulocytes: 0.05 10*3/uL (ref 0.00–0.07)
Basophils Absolute: 0 10*3/uL (ref 0.0–0.1)
Basophils Relative: 0 %
Eosinophils Absolute: 0.2 10*3/uL (ref 0.0–0.5)
Eosinophils Relative: 2 %
HCT: 25 % — ABNORMAL LOW (ref 36.0–46.0)
Hemoglobin: 6.2 g/dL — CL (ref 12.0–15.0)
Immature Granulocytes: 1 %
Lymphocytes Relative: 34 %
Lymphs Abs: 2.8 10*3/uL (ref 0.7–4.0)
MCH: 15.3 pg — ABNORMAL LOW (ref 26.0–34.0)
MCHC: 24.8 g/dL — ABNORMAL LOW (ref 30.0–36.0)
MCV: 61.6 fL — ABNORMAL LOW (ref 80.0–100.0)
Monocytes Absolute: 0.6 10*3/uL (ref 0.1–1.0)
Monocytes Relative: 7 %
Neutro Abs: 4.7 10*3/uL (ref 1.7–7.7)
Neutrophils Relative %: 56 %
Platelets: 310 10*3/uL (ref 150–400)
RBC: 4.06 MIL/uL (ref 3.87–5.11)
RDW: 25 % — ABNORMAL HIGH (ref 11.5–15.5)
WBC: 8.3 10*3/uL (ref 4.0–10.5)
nRBC: 0.4 % — ABNORMAL HIGH (ref 0.0–0.2)

## 2020-07-29 LAB — BASIC METABOLIC PANEL
Anion gap: 10 (ref 5–15)
BUN: 8 mg/dL (ref 6–20)
CO2: 21 mmol/L — ABNORMAL LOW (ref 22–32)
Calcium: 9.9 mg/dL (ref 8.9–10.3)
Chloride: 104 mmol/L (ref 98–111)
Creatinine, Ser: 0.58 mg/dL (ref 0.44–1.00)
GFR, Estimated: >60 mL/min (ref >60)
Glucose, Bld: 107 mg/dL — ABNORMAL HIGH (ref 70–99)
Potassium: 3.5 mmol/L (ref 3.5–5.1)
Sodium: 135 mmol/L (ref 135–145)

## 2020-07-29 LAB — RETICULOCYTES
Immature Retic Fract: 33.2 % — ABNORMAL HIGH (ref 2.3–15.9)
RBC.: 4.04 MIL/uL (ref 3.87–5.11)
Retic Count, Absolute: 152.7 10*3/uL (ref 19.0–186.0)
Retic Ct Pct: 3.8 % — ABNORMAL HIGH (ref 0.4–3.1)

## 2020-07-29 LAB — FERRITIN: Ferritin: 3 ng/mL — ABNORMAL LOW (ref 11–307)

## 2020-07-29 LAB — IRON AND TIBC
Iron: 41 ug/dL (ref 28–170)
Saturation Ratios: 7 % — ABNORMAL LOW (ref 10.4–31.8)
TIBC: 563 ug/dL — ABNORMAL HIGH (ref 250–450)
UIBC: 522 ug/dL

## 2020-07-29 LAB — TYPE AND SCREEN
ABO/RH(D): O POS
Antibody Screen: NEGATIVE

## 2020-07-29 LAB — FOLATE: Folate: 17 ng/mL (ref >5.9)

## 2020-07-29 LAB — VITAMIN B12: Vitamin B-12: 217 pg/mL (ref 180–914)

## 2020-07-29 MED ORDER — FERROUS SULFATE 325 (65 FE) MG PO TABS: 325.0000 mg | ORAL_TABLET | Freq: Two times a day (BID) | ORAL | 0 refills | Status: DC

## 2020-07-29 MED ORDER — SODIUM CHLORIDE 0.9 % IV SOLN
500.0000 mg | Freq: Once | INTRAVENOUS | Status: AC
  Administered 2020-07-29: 20:00:00 500 mg via INTRAVENOUS
  Filled 2020-07-29: qty 25

## 2020-07-29 NOTE — ED Triage Notes (Addendum)
Pt reports going to PCP yesterday and her iron was 5.3. PCP suggested coming here to get iron infusion. Pt reports mild fatigue, but denies any other symptoms. Pt is roughly [redacted] weeks pregnant.

## 2020-07-29 NOTE — ED Notes (Signed)
Pt called for v/s, no answer.

## 2020-07-29 NOTE — ED Provider Notes (Signed)
[facility] DEPT Provider Note   CSN: [id number] Arrival date & time: 07/29/20  1132     History Chief Complaint  Patient presents with  . Abnormal Lab    Judy Harris is a 34 y.o. female.  Presented to ER with concern for abnormal blood work.  G6, P5 at 11 weeks.  Patient reports that she went to [facility], had ultrasound done which confirmed IUP, had basic blood work completed which showed anemia.  Patient states that she has had prior issues with anemia thought to be related to iron deficiency.  She has no complaints at this time.  Thinks she may be slightly fatigued but had associated this with pregnancy.  Denies blood in stool or dark tarry stool.  No vaginal bleeding or pelvic cramping.  No shortness of breath, no lightheadedness or episodes of passing out.  HPI     Past Medical History:  Diagnosis Date  . Anemia    Has taken Iron supp;is currently taking now  . Infection    Yeast inf;not freq  . Infection    BV;freq in past  . Infection    UTI;no freq  . SOB (shortness of breath) 2013   Since the preg;SOB @ times    Patient Active Problem List   Diagnosis Date Noted  . S/P cesarean section 10/15/2015  . Active labor 06/16/2014  . Supervision of normal pregnancy in second trimester 02/18/2014  . Anemia 03/01/2012  . Preterm delivery, delivered 03/01/2012    Past Surgical History:  Procedure Laterality Date  . CESAREAN SECTION N/A 10/15/2015   Procedure: CESAREAN SECTION;  Surgeon: Florian Buff, MD;  Location: [facility];  Service: Obstetrics;  Laterality: N/A;  . ECTOPIC PREGNANCY SURGERY       OB History    Gravida  6   Para  5   Term  3   Preterm  2   AB  1   Living  5     SAB      IAB      Ectopic  1   Multiple  0   Live Births  5           Family History  Problem Relation Age of Onset  . Diabetes Maternal Grandmother   . Hypertension Maternal Grandmother   . Stroke Maternal  Grandmother   . Hypertension Paternal Grandmother   . Diabetes Paternal Grandmother   . Heart disease Paternal Grandmother   . Cancer Paternal Aunt        Breast    Social History   Tobacco Use  . Smoking status: Never Smoker  . Smokeless tobacco: Never Used  Substance Use Topics  . Alcohol use: Yes    Comment: Wine occasionally when not pregnant  . Drug use: No    Home Medications Prior to Admission medications   Medication Sig Start Date End Date Taking? Authorizing Provider  ferrous sulfate 325 (65 FE) MG tablet Take 1 tablet (325 mg total) by mouth 3 (three) times daily with meals. 10/18/15  Yes [place], Michael J, MD  ibuprofen (ADVIL) 200 MG tablet Take 400 mg by mouth every 6 (six) hours as needed for headache or mild pain.   Yes [provider]  ferrous sulfate 325 (65 FE) MG tablet Take 1 tablet (325 mg total) by mouth 2 (two) times daily with a meal. 07/29/20 08/28/20  Lucrezia Starch, MD  ibuprofen (ADVIL,MOTRIN) 600 MG tablet Take 1 tablet (600 mg  total) by mouth every 6 (six) hours as needed. Patient not taking: Reported on 07/29/2020 11/05/16   Recardo Evangelist, PA-C  oxyCODONE-acetaminophen (PERCOCET) 5-325 mg TABS tablet Take 6 tablets by mouth every 6 (six) hours as needed. Patient not taking: Reported on 07/29/2020 10/18/15   [place], Michael J, MD    Allergies    Patient has no known allergies.  Review of Systems   Review of Systems  Constitutional: Negative for chills and fever.  HENT: Negative for ear pain and sore throat.   Eyes: Negative for pain and visual disturbance.  Respiratory: Negative for cough and shortness of breath.   Cardiovascular: Negative for chest pain and palpitations.  Gastrointestinal: Negative for abdominal pain and vomiting.  Genitourinary: Negative for dysuria and hematuria.  Musculoskeletal: Negative for arthralgias and back pain.  Skin: Negative for color change and rash.  Neurological: Negative for seizures and  syncope.  All other systems reviewed and are negative.   Physical Exam Updated Vital Signs BP 129/60   Pulse 88   Temp 98.9 F (37.2 C) (Oral)   Resp (!) 23   SpO2 100%   Physical Exam Vitals and nursing note reviewed.  Constitutional:      General: She is not in acute distress.    Appearance: She is well-developed and well-nourished.  HENT:     Head: Normocephalic and atraumatic.  Eyes:     Pupils: Pupils are equal, round, and reactive to light.     Comments: Pale conjunctiva  Cardiovascular:     Rate and Rhythm: Normal rate and regular rhythm.     Heart sounds: No murmur heard.   Pulmonary:     Effort: Pulmonary effort is normal. No respiratory distress.     Breath sounds: Normal breath sounds.  Abdominal:     Palpations: Abdomen is soft.     Tenderness: There is no abdominal tenderness.  Musculoskeletal:        General: No deformity, signs of injury or edema.     Cervical back: Neck supple.  Skin:    General: Skin is warm and dry.  Neurological:     General: No focal deficit present.     Mental Status: She is alert.  Psychiatric:        Mood and Affect: Mood and affect and mood normal.        Behavior: Behavior normal.     ED Results / Procedures / Treatments   Labs (all labs ordered are listed, but only abnormal results are displayed) Labs Reviewed  CBC WITH DIFFERENTIAL/PLATELET - Abnormal; Notable for the following components:      Result Value   Hemoglobin 6.2 (*)    HCT 25.0 (*)    MCV 61.6 (*)    MCH 15.3 (*)    MCHC 24.8 (*)    RDW 25.0 (*)    nRBC 0.4 (*)    All other components within normal limits  BASIC METABOLIC PANEL - Abnormal; Notable for the following components:   CO2 21 (*)    Glucose, Bld 107 (*)    All other components within normal limits  IRON AND TIBC - Abnormal; Notable for the following components:   TIBC 563 (*)    Saturation Ratios 7 (*)    All other components within normal limits  FERRITIN - Abnormal; Notable for  the following components:   Ferritin 3 (*)    All other components within normal limits  RETICULOCYTES - Abnormal; Notable for the following components:  Retic Ct Pct 3.8 (*)    Immature Retic Fract 33.2 (*)    All other components within normal limits  VITAMIN B12  FOLATE  TYPE AND SCREEN    EKG EKG Interpretation  Date/Time:  Wednesday July 29 2020 17:23:59 EST Ventricular Rate:  91 PR Interval:    QRS Duration: 72 QT Interval:  360 QTC Calculation: 443 R Axis:   66 Text Interpretation: Sinus rhythm Abnormal R-wave progression, early transition Confirmed by Marianna Fuss ([id number]) on 07/29/2020 6:01:19 PM   Radiology No results found.  Procedures Procedures (including critical care time)  Medications Ordered in ED Medications  iron sucrose (VENOFER) 500 mg in sodium chloride 0.9 % 250 mL IVPB ( Intravenous Restarted 07/29/20 2103)    ED Course  I have reviewed the triage vital signs and the nursing notes.  Pertinent labs & imaging results that were available during my care of the patient were reviewed by me and considered in my medical decision making (see chart for details).  Clinical Course as of 07/29/20 2255  Wed Jul 29, 2020  1705 G6P5 at 11 weeks, has had confirmatory [place] [RD]  1706 OB is [facility] - Dr. Rosey Bath [RD]  1910 Reviewed case with Dr. Despina Hidden on-call for OB/GYN, he recommends IV iron infusion, defer blood transfusion for now and close follow-up in their clinic [RD]    Clinical Course User Index [RD] Milagros Loll, MD   MDM Rules/Calculators/A&P                         34 year old lady presents to ER with concern for abnormal blood work.  She reports being [redacted] weeks pregnant and having prior ultrasound confirmed IUP.  Blood work today concerning for anemia, very microcytic.  Reviewed case in detail with on-call OB/GYN, Dr. Despina Hidden. He recommended given dose of IV iron and close f/u in their clinic. Suspects most likely more chornic. Will send  further anemia panel. Additionally recommended f/u with hematology. Offered transfusion, admission, however, patient states she strongly wishes to pursue out patient therapy if possible. She has no specific symptoms and stable vital signs, will dc home.   After the discussed management above, the patient was determined to be safe for discharge.  The patient was in agreement with this plan and all questions regarding their care were answered.  ED return precautions were discussed and the patient will return to the ED with any significant worsening of condition.   Final Clinical Impression(s) / ED Diagnoses Final diagnoses:  Anemia, unspecified type  First trimester pregnancy    Rx / DC Orders ED Discharge Orders         Ordered    ferrous sulfate 325 (65 FE) MG tablet  2 times daily with meals        07/29/20 2242           Milagros Loll, MD 07/29/20 2308

## 2020-07-29 NOTE — ED Notes (Signed)
Infusion paused d/t equipment issues

## 2020-07-29 NOTE — Discharge Instructions (Signed)
Please schedule follow-up appointment with gynecology.  Additionally recommend following up with hematology.  If you develop any difficulty breathing, fatigue, shortness of breath, return to ER for reassessment.

## 2020-07-30 NOTE — ED Notes (Signed)
Pt getting dressed.

## 2020-07-30 NOTE — ED Notes (Signed)
Pt ambulated to bathroom 

## 2023-09-18 ENCOUNTER — Encounter (HOSPITAL_COMMUNITY): Payer: Self-pay | Admitting: *Deleted

## 2023-09-18 ENCOUNTER — Other Ambulatory Visit: Payer: Self-pay

## 2023-09-18 ENCOUNTER — Ambulatory Visit (HOSPITAL_COMMUNITY)
Admission: EM | Admit: 2023-09-18 | Discharge: 2023-09-18 | Disposition: A | Discharge status: Home / Self Care | Payer: Medicaid Other | Attending: Emergency Medicine | Admitting: Emergency Medicine

## 2023-09-18 DIAGNOSIS — J329 Chronic sinusitis, unspecified: Secondary | ICD-10-CM | POA: Diagnosis not present

## 2023-09-18 DIAGNOSIS — B9789 Other viral agents as the cause of diseases classified elsewhere: Secondary | ICD-10-CM

## 2023-09-18 MED ORDER — IBUPROFEN 600 MG PO TABS: 600.0000 mg | ORAL_TABLET | Freq: Four times a day (QID) | ORAL | 0 refills | Status: DC | PRN

## 2023-09-18 MED ORDER — GUAIFENESIN 400 MG PO TABS: 400.0000 mg | ORAL_TABLET | ORAL | 0 refills | Status: DC

## 2023-09-18 NOTE — Discharge Instructions (Addendum)
Ibuprofen or tylenol for any pain/pressure You can also use daily nasal spray I recommend mucinex (guaifenesin) -- one 400 mg tablet every 4 hours, taken with LOTS of fluids  You can get the cheapest medications at [facility] which is located at [address] (Medication price list is attached)

## 2023-09-18 NOTE — ED Triage Notes (Signed)
PT has had a runny nose and cough. Pt has developed pressure on RT side of face.

## 2023-09-18 NOTE — ED Provider Notes (Signed)
[facility]    CSN: [id number] Arrival date & time: 09/18/23  0347      History   Chief Complaint Chief Complaint  Patient presents with   Cough   Nasal Congestion    HPI Judy Harris is a 38 y.o. female.  About 6 or 7 days of runny nose and congestion, symptoms have been gradually improving over the week. Reports almost resolved. She did have some cough on and off. No fevers. Was using dayquil/nyquil Concerned about a pressure she developed in the right side of face/behind the eye last night. Not painful, just feels swollen. No meds taken for this yet  Past Medical History:  Diagnosis Date   Anemia    Has taken Iron supp;is currently taking now   Infection    Yeast inf;not freq   Infection    BV;freq in past   Infection    UTI;no freq   SOB (shortness of breath) 2013   Since the preg;SOB @ times    Patient Active Problem List   Diagnosis Date Noted   S/P cesarean section 10/15/2015   Active labor 06/16/2014   Supervision of normal pregnancy in second trimester 02/18/2014   Anemia 03/01/2012   Preterm delivery, delivered 03/01/2012    Past Surgical History:  Procedure Laterality Date   CESAREAN SECTION N/A 10/15/2015   Procedure: CESAREAN SECTION;  Surgeon: Lazaro Arms, MD;  Location: [facility];  Service: Obstetrics;  Laterality: N/A;   ECTOPIC PREGNANCY SURGERY      OB History     Gravida  6   Para  5   Term  3   Preterm  2   AB  1   Living  5      SAB      IAB      Ectopic  1   Multiple  0   Live Births  5            Home Medications    Prior to Admission medications   Medication Sig Start Date End Date Taking? Authorizing Provider  ferrous sulfate 325 (65 FE) MG tablet Take 1 tablet (325 mg total) by mouth 3 (three) times daily with meals. 10/18/15  Yes [place], Michael J, MD  guaifenesin (HUMIBID E) 400 MG TABS tablet Take 1 tablet (400 mg total) by mouth every 4 (four) hours. 09/18/23  Yes Jacobi Ryant, Lurena Joiner, PA-C   ibuprofen (ADVIL) 600 MG tablet Take 1 tablet (600 mg total) by mouth every 6 (six) hours as needed. 09/18/23  Yes Neah Sporrer, Lurena Joiner, PA-C  ferrous sulfate 325 (65 FE) MG tablet Take 1 tablet (325 mg total) by mouth 2 (two) times daily with a meal. 07/29/20 08/28/20  Milagros Loll, MD    Family History Family History  Problem Relation Age of Onset   Diabetes Maternal Grandmother    Hypertension Maternal Grandmother    Stroke Maternal Grandmother    Hypertension Paternal Grandmother    Diabetes Paternal Grandmother    Heart disease Paternal Grandmother    Cancer Paternal Aunt        Breast    Social History Social History   Tobacco Use   Smoking status: Never   Smokeless tobacco: Never  Substance Use Topics   Alcohol use: Yes    Comment: Wine occasionally when not pregnant   Drug use: No     Allergies   Patient has no known allergies.   Review of Systems Review of Systems Per HPI  Physical Exam Triage Vital Signs ED Triage Vitals [09/18/23 0910]  Encounter Vitals Group     BP      Systolic BP Percentile      Diastolic BP Percentile      Pulse      Resp      Temp      Temp src      SpO2      Weight      Height      Head Circumference      Peak Flow      Pain Score 5     Pain Loc      Pain Education      Exclude from Growth Chart    No data found.  Updated Vital Signs BP (!) 112/54   Pulse 72   Temp 98.1 F (36.7 C)   Resp 18   LMP 09/18/2023   SpO2 100%    Physical Exam Vitals and nursing note reviewed.  Constitutional:      General: She is not in acute distress.    Appearance: She is not ill-appearing.  HENT:     Head:     Comments: No facial swelling    Right Ear: Tympanic membrane and ear canal normal.     Left Ear: Tympanic membrane and ear canal normal.     Nose: No congestion or rhinorrhea.     Mouth/Throat:     Mouth: Mucous membranes are moist.     Pharynx: Oropharynx is clear. No oropharyngeal exudate or posterior  oropharyngeal erythema.  Eyes:     Conjunctiva/sclera: Conjunctivae normal.  Cardiovascular:     Rate and Rhythm: Normal rate and regular rhythm.     Pulses: Normal pulses.     Heart sounds: Normal heart sounds.  Pulmonary:     Effort: Pulmonary effort is normal.     Breath sounds: Normal breath sounds. No wheezing or rales.  Musculoskeletal:     Cervical back: Normal range of motion. No rigidity or tenderness.  Lymphadenopathy:     Cervical: No cervical adenopathy.  Skin:    General: Skin is warm and dry.  Neurological:     Mental Status: She is alert and oriented to person, place, and time.     UC Treatments / Results  Labs (all labs ordered are listed, but only abnormal results are displayed) Labs Reviewed - No data to display  EKG   Radiology No results found.  Procedures Procedures (including critical care time)  Medications Ordered in UC Medications - No data to display  Initial Impression / Assessment and Plan / UC Course  I have reviewed the triage vital signs and the nursing notes.  Pertinent labs & imaging results that were available during my care of the patient were reviewed by me and considered in my medical decision making (see chart for details).  Afebrile and well-appearing.  She reports symptoms have pretty much resolved until last night developing some pressure.  Again does not report any pain.  She is concerned about swelling however there is nothing noted on physical exam.  Reassuring she has been improving congestion wise, recommend allowing a few more days and trying nasal spray, guaifenesin, ibuprofen or Tylenol and increase fluids. Can return if needed. Patient agrees to plan, all questions answered  Final Clinical Impressions(s) / UC Diagnoses   Final diagnoses:  Viral sinusitis     Discharge Instructions      Ibuprofen or tylenol for any pain/pressure You can also  use daily nasal spray I recommend mucinex (guaifenesin) -- one 400 mg  tablet every 4 hours, taken with LOTS of fluids  You can get the cheapest medications at [facility] which is located at [address] (Medication price list is attached)     ED Prescriptions     Medication Sig Dispense Auth. Provider   ibuprofen (ADVIL) 600 MG tablet Take 1 tablet (600 mg total) by mouth every 6 (six) hours as needed. 30 tablet Brittania Sudbeck, PA-C   guaifenesin (HUMIBID E) 400 MG TABS tablet Take 1 tablet (400 mg total) by mouth every 4 (four) hours. 30 tablet Mckenize Mezera, Lurena Joiner, PA-C      PDMP not reviewed this encounter.   Marlow Baars, [place] 09/18/23 [phone]

## 2023-12-27 ENCOUNTER — Encounter (HOSPITAL_COMMUNITY): Payer: Self-pay

## 2023-12-27 ENCOUNTER — Emergency Department (HOSPITAL_COMMUNITY)
Admission: EM | Admit: 2023-12-27 | Discharge: 2023-12-27 | Disposition: A | Discharge status: Home / Self Care | Attending: Emergency Medicine | Admitting: Emergency Medicine

## 2023-12-27 ENCOUNTER — Other Ambulatory Visit: Payer: Self-pay

## 2023-12-27 DIAGNOSIS — R519 Headache, unspecified: Secondary | ICD-10-CM | POA: Diagnosis present

## 2023-12-27 MED ORDER — AZITHROMYCIN 250 MG PO TABS: ORAL_TABLET | ORAL | 0 refills | Status: AC

## 2023-12-27 NOTE — ED Triage Notes (Signed)
 Pt came in via POV d/t an intermittent HA that she noticed started about 1 month ago. States she feels are her Rt eye & it "shoots" up to the top of her head. States she had a cold with allergies recently & thought it was because of that but the HA is persisting intermittently. A/Ox4, rates her pain 7/10 when it is bothering her.

## 2023-12-27 NOTE — Discharge Instructions (Signed)
 1.  At this time it appears likely that your right sided headache is due to sinus pain and possibly infection.  You are being treated with an antibiotic called Zithromax.  Take this daily for 5 days as prescribed. 2.  Take extra strength Tylenol  every 6 hours as needed for headache. 3.  Try to get extra rest and hydrate well. 4.  Return to the emergency department if you have any change or difficulty with your vision, you feel off balance, your headache is suddenly severe, you have any confusion or other concerning changes. 5.  Schedule a follow-up with your family doctor in approximately 7 to 10 days for recheck.

## 2023-12-27 NOTE — ED Provider Notes (Signed)
 [facility]ENC[facility] AT [facility] [place] Provider Note   CSN: [id number] Arrival date & time: 12/27/23  1478     History  Chief Complaint  Patient presents with   Headache    Judy Harris is a 38 y.o. female.  HPI Patient reports she has had a waxing and waning pressure headache behind the right eye, right forehead and right parietal area.  She reports the headache has been gradual in onset and waxes and wanes with pressure quality.  No sharp or sudden pain associated with this.  Patient Ward symptoms have been going on for about a week.  Patient reports that she does get sinus symptoms with the high pollen and weather change.  She has been dealing with congestion and drainage.  Patient denies any nasal bleeding.  No fever.  No pain in her eye.  No visual change.  No neck stiffness.  No confusion.  No dizziness.  Patient reports that she does take Allegra-D and had been taking it consistently but got busy and has not been taking it much over the past week.  Patient reports that she does stay very busy working a job and caring for 4 small children as well as the adult child.  She has continued to manage all of her usual daily activities but feels that she is fatigued and probably needs some rest.  Patient reports that she wants to make sure there is nothing seriously wrong and was worried because she needs to be well to take care of her family and job.    Home Medications Prior to Admission medications   Medication Sig Start Date End Date Taking? Authorizing Provider  azithromycin (ZITHROMAX Z-PAK) 250 MG tablet 2 po day one, then 1 daily x 4 days 12/27/23  Yes Kallyn Demarcus, Bufford Carne, MD  ferrous sulfate  325 (65 FE) MG tablet Take 1 tablet (325 mg total) by mouth 3 (three) times daily with meals. 10/18/15   [place], Michael J, MD  ferrous sulfate  325 (65 FE) MG tablet Take 1 tablet (325 mg total) by mouth 2 (two) times daily with a meal. 07/29/20 08/28/20  Camellia Caves, MD   guaifenesin  (HUMIBID E) 400 MG TABS tablet Take 1 tablet (400 mg total) by mouth every 4 (four) hours. 09/18/23   Rising, Ivette Marks, PA-C  ibuprofen  (ADVIL ) 600 MG tablet Take 1 tablet (600 mg total) by mouth every 6 (six) hours as needed. 09/18/23   Rising, Ivette Marks, PA-C      Allergies    Patient has no known allergies.    Review of Systems   Review of Systems  Physical Exam Updated Vital Signs BP 113/85 (BP Location: Right Arm)   Pulse 95   Temp 98.3 F (36.8 C) (Oral)   Resp 20   Ht 5\' 3"  (1.6 m)   Wt 72.6 kg   SpO2 100%   BMI 28.34 kg/m  Physical Exam Constitutional:      Comments: Alert nontoxic clinically well in appearance.  Well-nourished well-developed.  HENT:     Head:     Comments: No facial swelling or reproducible tenderness over the sinuses.  No rashes over the face or hair or behind the ear.    Right Ear: Tympanic membrane normal.     Left Ear: Tympanic membrane normal.     Nose:     Comments: Patient has some modest enlargement and bogginess of the turbinates on the right nasal passage compared to the left.  No bleeding or purulent  drainage.    Mouth/Throat:     Comments: Then patient is in good condition.  Patient has applied metal braces to the upper and lower teeth.  The mucous membranes are pink and moist in good condition.  Posterior airway is widely patent without tonsillar erythema or enlargement. Eyes:     Extraocular Movements: Extraocular movements intact.     Conjunctiva/sclera: Conjunctivae normal.     Pupils: Pupils are equal, round, and reactive to light.  Cardiovascular:     Rate and Rhythm: Normal rate and regular rhythm.  Pulmonary:     Effort: Pulmonary effort is normal.     Breath sounds: Normal breath sounds.  Abdominal:     General: There is no distension.     Palpations: Abdomen is soft.  Musculoskeletal:        General: Normal range of motion.     Cervical back: Normal range of motion and neck supple. No rigidity.  Lymphadenopathy:      Cervical: No cervical adenopathy.  Skin:    General: Skin is warm and dry.  Neurological:     General: No focal deficit present.     Mental Status: She is oriented to person, place, and time.     Cranial Nerves: No cranial nerve deficit.     Motor: No weakness.     Coordination: Coordination normal.  Psychiatric:        Mood and Affect: Mood normal.     ED Results / Procedures / Treatments   Labs (all labs ordered are listed, but only abnormal results are displayed) Labs Reviewed - No data to display  EKG None  Radiology No results found.  Procedures Procedures    Medications Ordered in ED Medications - No data to display  ED Course/ Medical Decision Making/ A&P                                 Medical Decision Making  Patient presents as outlined.  She has a right sided headache that is mild to moderate pressure quality, waxing and waning and indolent over the past week with longer, preceding sinus-like symptoms.  Patient's ENT exam is completely normal.  She does have metal braces with wire which could contribute to headache.  However, patient has had them for about a year and does not seem to have maxillary pain.  I do not suspect any dental abscess or intraoral source for the pain.  Patient does describe sensitivity to sinus discomfort with seasonal changes and fairly consistently uses Allegra although has not taken it for about the past week.  With unilateral pressure-like discomfort and no concerning symptoms such as sudden onset, severe pain, ocular pain, fever or neurologic symptoms, I do feel patient is appropriate for a trial of antibiotic for sinusitis.  Patient is normotensive, has no family history of aneurysm or tumor, pain quality course and physical exam do not suggest aneurysm or brain tumor.  Patient has concern for serious etiology of headache but at this time clinically and by history there is no suggestion of this.  We discussed this and we will  trial course of Zithromax for what appears more likely to be sinusitis with follow-up with PCP.  We have reviewed careful return precautions.        Final Clinical Impression(s) / ED Diagnoses Final diagnoses:  Acute nonintractable headache, unspecified headache type    Rx / DC Orders ED Discharge Orders  Ordered    azithromycin (ZITHROMAX Z-PAK) 250 MG tablet        12/27/23 1191              Wynetta Heckle, MD 12/27/23 [phone]

## 2023-12-27 NOTE — ED Notes (Signed)
 Patient discharged by RN. Patient verbalizes understanding of instructions with no additional questions. Ambulatory to lobby.
